# Patient Record
Sex: Female | Born: 1965 | Race: White | Hispanic: No | Marital: Married | Smoking: Never smoker
Health system: Southern US, Community
[De-identification: ages and names within clinical notes are randomized; demographics above are authoritative.]

## PROBLEM LIST (undated history)

## (undated) DIAGNOSIS — I10 Essential (primary) hypertension: Secondary | ICD-10-CM

## (undated) DIAGNOSIS — E119 Type 2 diabetes mellitus without complications: Secondary | ICD-10-CM

## (undated) DIAGNOSIS — I739 Peripheral vascular disease, unspecified: Secondary | ICD-10-CM

## (undated) HISTORY — PX: GALLBLADDER SURGERY: SHX652

## (undated) HISTORY — DX: Peripheral vascular disease, unspecified: I73.9

## (undated) HISTORY — PX: NECK SURGERY: SHX720

---

## 1997-08-31 ENCOUNTER — Encounter (HOSPITAL_COMMUNITY): Admission: RE | Admit: 1997-08-31 | Discharge: 1997-09-11 | Payer: Self-pay | Admitting: Obstetrics and Gynecology

## 1997-09-05 ENCOUNTER — Inpatient Hospital Stay (HOSPITAL_COMMUNITY): Admission: AD | Admit: 1997-09-05 | Discharge: 1997-09-05 | Payer: Self-pay | Admitting: Obstetrics and Gynecology

## 1997-09-09 ENCOUNTER — Inpatient Hospital Stay (HOSPITAL_COMMUNITY): Admission: AD | Admit: 1997-09-09 | Discharge: 1997-09-11 | Payer: Self-pay | Admitting: *Deleted

## 2000-04-14 ENCOUNTER — Other Ambulatory Visit: Admission: RE | Admit: 2000-04-14 | Discharge: 2000-04-14 | Payer: Self-pay | Admitting: Obstetrics and Gynecology

## 2000-08-11 ENCOUNTER — Encounter (HOSPITAL_BASED_OUTPATIENT_CLINIC_OR_DEPARTMENT_OTHER): Payer: Self-pay | Admitting: General Surgery

## 2000-08-14 ENCOUNTER — Encounter (INDEPENDENT_AMBULATORY_CARE_PROVIDER_SITE_OTHER): Payer: Self-pay | Admitting: Specialist

## 2000-08-14 ENCOUNTER — Ambulatory Visit (HOSPITAL_COMMUNITY): Admission: RE | Admit: 2000-08-14 | Discharge: 2000-08-14 | Payer: Self-pay | Admitting: General Surgery

## 2001-07-22 ENCOUNTER — Other Ambulatory Visit: Admission: RE | Admit: 2001-07-22 | Discharge: 2001-07-22 | Payer: Self-pay | Admitting: Obstetrics and Gynecology

## 2002-03-20 ENCOUNTER — Encounter: Payer: Self-pay | Admitting: Emergency Medicine

## 2002-03-20 ENCOUNTER — Inpatient Hospital Stay (HOSPITAL_COMMUNITY): Admission: EM | Admit: 2002-03-20 | Discharge: 2002-03-23 | Payer: Self-pay | Admitting: Emergency Medicine

## 2002-03-22 ENCOUNTER — Encounter (INDEPENDENT_AMBULATORY_CARE_PROVIDER_SITE_OTHER): Payer: Self-pay | Admitting: Specialist

## 2002-03-22 ENCOUNTER — Encounter (HOSPITAL_BASED_OUTPATIENT_CLINIC_OR_DEPARTMENT_OTHER): Payer: Self-pay | Admitting: General Surgery

## 2002-11-18 ENCOUNTER — Encounter: Payer: Self-pay | Admitting: Family Medicine

## 2002-11-18 ENCOUNTER — Encounter: Admission: RE | Admit: 2002-11-18 | Discharge: 2002-11-18 | Payer: Self-pay | Admitting: Family Medicine

## 2003-12-07 ENCOUNTER — Encounter: Admission: RE | Admit: 2003-12-07 | Discharge: 2003-12-07 | Payer: Self-pay | Admitting: Obstetrics and Gynecology

## 2005-04-22 ENCOUNTER — Encounter: Admission: RE | Admit: 2005-04-22 | Discharge: 2005-04-22 | Payer: Self-pay | Admitting: Obstetrics and Gynecology

## 2005-07-11 ENCOUNTER — Encounter: Admission: RE | Admit: 2005-07-11 | Discharge: 2005-07-11 | Payer: Self-pay | Admitting: Family Medicine

## 2006-03-08 ENCOUNTER — Emergency Department (HOSPITAL_COMMUNITY): Admission: EM | Admit: 2006-03-08 | Discharge: 2006-03-08 | Payer: Self-pay | Admitting: Emergency Medicine

## 2006-06-26 ENCOUNTER — Encounter: Admission: RE | Admit: 2006-06-26 | Discharge: 2006-06-26 | Payer: Self-pay | Admitting: Obstetrics and Gynecology

## 2008-09-07 ENCOUNTER — Encounter: Admission: RE | Admit: 2008-09-07 | Discharge: 2008-09-07 | Payer: Self-pay | Admitting: Obstetrics and Gynecology

## 2010-06-03 ENCOUNTER — Encounter: Payer: Self-pay | Admitting: Obstetrics and Gynecology

## 2010-07-03 ENCOUNTER — Other Ambulatory Visit: Payer: Self-pay | Admitting: Family Medicine

## 2010-07-03 DIAGNOSIS — Z1231 Encounter for screening mammogram for malignant neoplasm of breast: Secondary | ICD-10-CM

## 2010-07-12 ENCOUNTER — Ambulatory Visit
Admission: RE | Admit: 2010-07-12 | Discharge: 2010-07-12 | Disposition: A | Discharge status: Home / Self Care | Payer: BC Managed Care – PPO | Source: Ambulatory Visit | Attending: Family Medicine | Admitting: Family Medicine

## 2010-07-12 DIAGNOSIS — Z1231 Encounter for screening mammogram for malignant neoplasm of breast: Secondary | ICD-10-CM

## 2010-09-27 NOTE — Discharge Summary (Signed)
   NAME:  Sherri Thomas, Sherri Thomas                         ACCOUNT NO.:  [id number]   MEDICAL RECORD NO.:  [id number]                   PATIENT TYPE:  INP   LOCATION:  5734                                 FACILITY:  [facility]   PHYSICIAN:  Leonie Man, M.D.                DATE OF BIRTH:  1966/01/15   DATE OF ADMISSION:  03/20/2002  DATE OF DISCHARGE:  03/23/2002                                 DISCHARGE SUMMARY   ADMISSION DIAGNOSIS:  Chronic calculus cholecystitis with biliary colic.   DISCHARGE DIAGNOSIS:  Chronic calculus cholecystitis with biliary colic.   PROCEDURE:  Laparoscopic cholecystectomy with intraoperative cholangiogram.   SURGEON:  Leonie Man, M.D.   ASSISTANT:  Joanne Gavel, M.D.   ANESTHESIA:  General.   HISTORY OF PRESENT ILLNESS:  The patient is a 45 year old woman presenting  with severe upper abdominal pain associated with nausea and vomiting.  On  gallbladder ultrasound she was noted to have cholelithiasis with the  gallbladder showing a very thickened wall.  She was admitted to the hospital  and continued to have pain without resolution after being treated with  antibiotics.   HOSPITAL COURSE:  She was then taken to the operating room on 03/22/02,  where she underwent a laparoscopic cholecystectomy with intraoperative  cholangiogram.  Her postoperative course over the insuring 24 hours has been  benign with normal with normal resumption of diet and bowel activity.  She  is discharged now to be followed up in the office in two weeks.   DISCHARGE MEDICATIONS:  Maxidone one or two q.4h. p.r.n. pain.   ACTIVITY:  As tolerated.   DIET:  Unrestricted.                                                  Leonie Man, M.D.    PB/MEDQ  D:  04/20/2002  T:  04/20/2002  Job:  [id number]

## 2010-09-27 NOTE — H&P (Signed)
   NAME:  Sherri Thomas, Sherri Thomas                         ACCOUNT NO.:  [id number]   MEDICAL RECORD NO.:  [id number]                   PATIENT TYPE:  INP   LOCATION:  1823                                 FACILITY:  [facility]   PHYSICIAN:  Gabrielle Dare. Janee Morn, M.D.             DATE OF BIRTH:  June 07, 1965   DATE OF ADMISSION:  03/20/2002  DATE OF DISCHARGE:                                HISTORY & PHYSICAL   ADDENDUM:  Her laboratory values are as follows:  White blood cell count  10,000, hemoglobin 13, hematocrit 37.4, platelets 345, sodium 134,  potassium, chloride 103, CO2 28, BUN 13, creatinine 0.7, glucose 124, AST  155, ALT 105, alk phos 82 and bilirubin 0.9.                                               Gabrielle Dare Janee Morn, M.D.    BET/MEDQ  D:  03/20/2002  T:  03/21/2002  Job:  [id number]

## 2010-09-27 NOTE — H&P (Signed)
NAME:  Sherri Thomas, Sherri Thomas                         ACCOUNT NO.:  [id number]   MEDICAL RECORD NO.:  [id number]                   PATIENT TYPE:  INP   LOCATION:  1823                                 FACILITY:  [facility]   PHYSICIAN:  Gabrielle Dare. Janee Morn, M.D.             DATE OF BIRTH:  10/05/1965   DATE OF ADMISSION:  03/20/2002  DATE OF DISCHARGE:                                HISTORY & PHYSICAL   HISTORY OF PRESENT ILLNESS:  The patient is a 45 year old white female that  complains of epigastric and right upper quadrant pain, starting at 12:15  p.m. last evening. This pain radiated around to her back and was associated  with some nausea. She had one episode of vomiting. The pain continued and so  she came to the emergency room for evaluation. Currently, she says that her  pain is a little bit better after the pain medication. She still has quite a  bit of epigastric discomfort and some nausea. Workup in the emergency room  included an ultrasound which demonstrated gallstones with no common bile  duct dilatation.   PAST MEDICAL HISTORY:  The patient has scoliosis but denies other medical  problems.   PAST SURGICAL HISTORY:  Includes removal of a lipoma from her abdomen by Dr.  Orson Slick about two years ago.  Also in the distant past, she had cervical  surgery for her scoliosis.   ALLERGIES:  No known drug allergies.   REVIEW OF SYSTEMS:  In general she has no complaints. Cardiac reveals no  chest pain. Pulmonary reveals no shortness of breath. GI: Please see HPI.  Neurologic, she denies any complaints. Psychiatric, she denies any  complaints. Otherwise negative.   PHYSICAL EXAMINATION:  VITAL SIGNS: Temperature 98.6, heart rate 90, blood  pressure 121/64, respiratory rate 18.  GENERAL: Awake, alert and in no acute distress.  HEENT: Sclera nonicteric. Pupils are equal, round, and reactive to light and  accommodation.  NECK: Supple. Old cervical scar anteriorly.  LUNGS: Clear to  auscultation and percussion bilaterally.  HEART: Regular rate and rhythm.  ABDOMEN: Soft with some mild right upper quadrant and epigastric tenderness.  She does have a scar in her left upper quadrant from her lipoma surgery.  EXTREMITIES: Warm with palpable distal pulses.   ASSESSMENT:  Cholecystitis.   PLAN:  We will plan to admit the patient for IV fluid hydration, IV  antibiotics and repeat her labs tomorrow. The patient firmly requests Dr.  Orson Slick to do her surgery, so I will contact him so he can plan on doing her  laparoscopic cholecystectomy.                                               Gabrielle Dare Janee Morn, M.D.    BET/MEDQ  D:  03/20/2002  T:  03/21/2002  Job:  [id number]

## 2010-09-27 NOTE — Op Note (Signed)
[place]  Patient:    Sherri Thomas, Sherri Thomas                        MRN: [id number] Proc. Date: 08/14/00 Attending:  Luisa Hart L. Lurene Shadow, M.D.                           Operative Report  PREOPERATIVE DIAGNOSIS:  Lipoma, left chest wall (submammary).  POSTOPERATIVE DIAGNOSIS:  Lipoma, left chest wall (submammary).  OPERATION PERFORMED:  Excision of lipoma, left chest wall.  SURGEON:  Mardene Celeste. Lurene Shadow, M.D.  ASSISTANT:  Nurse.  ANESTHESIA:  General.  INDICATIONS FOR PROCEDURE:  The patient is a 45 year old woman with an enlarging mass in the submammary region of the left chest wall, extending down to the left subcostal region.  This is causing some discomfort and ill-fitting of her undergarments.  She is brought to the operating room now for excision.   DESCRIPTION OF PROCEDURE:  Following the induction of satisfactory anesthesia, the patient was positioned supinely.  The anterior chest was prepped and draped to be included in a sterile operative field.  I made a transverse incision over the lipoma and deepened this through the skin and subcutaneous tissues.  The lipoma was dissected free from the surrounding tissues sharply and removed in its entirety and forwarded for pathologic evaluation.  The hemostasis was obtained with electrocautery.  Sponge, instrument and sharp counts were verified.  Wound closed in two layers with interrupted 3-0 Vicryl suture in the subcutaneous layer and running 5-0 Monocryl in the skin.  The wound was reinforced with Steri-Strips.  Sterile dressings applied. Anesthetic reversed.  Patient removed from the operating room to the recovery room in stable condition having tolerated the procedure well. DD:  08/14/00 TD:  08/14/00 Job: [id number]

## 2010-09-27 NOTE — Op Note (Signed)
NAME:  Sherri Thomas, Sherri Thomas                         ACCOUNT NO.:  [id number]   MEDICAL RECORD NO.:  [id number]                   PATIENT TYPE:  INP   LOCATION:  5734                                 FACILITY:  [facility]   PHYSICIAN:  Leonie Man, M.D.                DATE OF BIRTH:  [birth year]   DATE OF PROCEDURE:  03/22/2002  DATE OF DISCHARGE:                                 OPERATIVE REPORT   PREOPERATIVE DIAGNOSIS:  Acute cholecystitis.   POSTOPERATIVE DIAGNOSIS:  Acute cholecystitis.   OPERATION PERFORMED:  Laparoscopic cholecystectomy with operative  cholangiogram.   SURGEON:  Leonie Man, M.D.   ASSISTANT:  Joanne Gavel, M.D.   ANESTHESIA:  General.   INDICATIONS FOR PROCEDURE:  The patient is a 45 year old woman presenting  with severe upper abdominal pain, associated nausea and vomiting, who on  gallbladder ultrasound was noted to have cholelithiasis with gallbladder  showing thickened wall.  She was admitted to the hospital, continued to have  pain, scheduled and brought now to the operating room for laparoscopic  cholecystectomy.  Lipase and amylase levels have been within normal limits.  Liver function studies were mildly elevated but returned to normal very  quickly.  The patient understands the risks and potential benefits of  surgery and all questions are answered and consent obtained.   DESCRIPTION OF PROCEDURE:  Following induction of satisfactory anesthesia  with the patient positioned supinely, the abdomen was prepped and draped to  be included in the sterile operative field.  Open laparoscopy created at the  umbilicus with insertion of a Hasson type cannula and insufflation of the  peritoneal cavity to 14 mmHg pressure.  The camera was inserted and  exploration of the abdomen carried out.  Liver edges were sharp, liver  surfaces smooth, the gallbladder was distended and edematous.  None of the  large or small intestines viewed appeared to be abnormal.  The left  upper  quadrant was thoroughly viewed and there were no abnormalities noted.  Under  direct vision, epigastric and lateral ports were placed.  The gallbladder  was grasped and retracted cephalad.  Then the dissection carried down into  the region of the ampulla and the hepatoduodenal ligament with isolation of  the cystic artery and cystic duct.  The cystic duct was completely occluded  by a large stone which was carefully milked back into the gallbladder and  then clipped proximally at the gallbladder cystic duct junction.  The cystic  artery was then isolated, doubly clipped and transected.  The cystic duct  cholangiogram was then carried out  by inserting a Reddick catheter into the  cystic duct and injecting one half strength Hypaque into the biliary system.  The resulting cholangiogram showed free flow of contrast into the duodenum  with no filling defects.  The upper radicals appeared to be normal.  The  cystic catheter was then removed.  The cystic duct  was doubly clipped and  then transected.  The gallbladder was then dissected free from the liver bed  maintaining hemostasis throughout the course of dissection.  At the end of  dissection, the liver bed was thoroughly inspected.  Additional bleeding  points were treated with electrocautery.  The gallbladder was then placed in  an endo pouch and retrieved through the umbilicus without difficulty.  Sponge, instrument and sharp counts were then verified.  The  pneumoperitoneum was allowed to deflate and the wounds closed in  layers as  follows.  The umbilical wound in two layers with 0 Dexon and 4-0  Vicryl.  The epigastrium and lateral flank wounds were closed with 4-0  Vicryl sutures.  All wounds were reinforced with Steri-Strips.  Sterile  dressings were applied.  Anesthetic was reversed and the patient removed  from the operating room to the recovery room in stable condition, having  tolerated the procedure well.                                                  Leonie Man, M.D.    PB/MEDQ  D:  03/22/2002  T:  03/22/2002  Job:  [id number]

## 2011-07-09 ENCOUNTER — Other Ambulatory Visit: Payer: Self-pay | Admitting: Family Medicine

## 2011-07-09 DIAGNOSIS — Z1231 Encounter for screening mammogram for malignant neoplasm of breast: Secondary | ICD-10-CM

## 2011-07-17 ENCOUNTER — Ambulatory Visit
Admission: RE | Admit: 2011-07-17 | Discharge: 2011-07-17 | Disposition: A | Discharge status: Home / Self Care | Payer: BC Managed Care – PPO | Source: Ambulatory Visit | Attending: Family Medicine | Admitting: Family Medicine

## 2011-07-17 DIAGNOSIS — Z1231 Encounter for screening mammogram for malignant neoplasm of breast: Secondary | ICD-10-CM

## 2012-06-15 ENCOUNTER — Other Ambulatory Visit: Payer: Self-pay | Admitting: Family Medicine

## 2012-06-15 DIAGNOSIS — Z1231 Encounter for screening mammogram for malignant neoplasm of breast: Secondary | ICD-10-CM

## 2012-07-19 ENCOUNTER — Ambulatory Visit
Admission: RE | Admit: 2012-07-19 | Discharge: 2012-07-19 | Disposition: A | Discharge status: Home / Self Care | Payer: BC Managed Care – PPO | Source: Ambulatory Visit | Attending: Family Medicine | Admitting: Family Medicine

## 2012-07-19 DIAGNOSIS — Z1231 Encounter for screening mammogram for malignant neoplasm of breast: Secondary | ICD-10-CM

## 2013-07-26 ENCOUNTER — Other Ambulatory Visit: Payer: Self-pay

## 2013-07-26 DIAGNOSIS — Z1231 Encounter for screening mammogram for malignant neoplasm of breast: Secondary | ICD-10-CM

## 2013-07-28 ENCOUNTER — Ambulatory Visit
Admission: RE | Admit: 2013-07-28 | Discharge: 2013-07-28 | Disposition: A | Discharge status: Home / Self Care | Payer: BC Managed Care – PPO | Source: Ambulatory Visit

## 2013-07-28 DIAGNOSIS — Z1231 Encounter for screening mammogram for malignant neoplasm of breast: Secondary | ICD-10-CM

## 2013-10-19 ENCOUNTER — Other Ambulatory Visit: Payer: Self-pay | Admitting: Otolaryngology

## 2013-10-19 DIAGNOSIS — R221 Localized swelling, mass and lump, neck: Secondary | ICD-10-CM

## 2013-10-20 ENCOUNTER — Ambulatory Visit
Admission: RE | Admit: 2013-10-20 | Discharge: 2013-10-20 | Disposition: A | Discharge status: Home / Self Care | Payer: BC Managed Care – PPO | Source: Ambulatory Visit | Attending: Otolaryngology | Admitting: Otolaryngology

## 2013-10-20 ENCOUNTER — Encounter (INDEPENDENT_AMBULATORY_CARE_PROVIDER_SITE_OTHER): Payer: Self-pay

## 2013-10-20 DIAGNOSIS — R221 Localized swelling, mass and lump, neck: Secondary | ICD-10-CM

## 2013-10-20 MED ORDER — IOHEXOL 300 MG/ML  SOLN
75.0000 mL | Freq: Once | INTRAMUSCULAR | Status: AC | PRN
  Administered 2013-10-20: 75 mL via INTRAVENOUS

## 2013-10-24 ENCOUNTER — Other Ambulatory Visit: Payer: BC Managed Care – PPO

## 2014-06-09 ENCOUNTER — Ambulatory Visit (HOSPITAL_COMMUNITY)
Admission: RE | Admit: 2014-06-09 | Discharge: 2014-06-09 | Disposition: A | Discharge status: Home / Self Care | Payer: BLUE CROSS/BLUE SHIELD | Source: Ambulatory Visit | Attending: Family Medicine | Admitting: Family Medicine

## 2014-06-09 ENCOUNTER — Other Ambulatory Visit (HOSPITAL_COMMUNITY): Payer: Self-pay | Admitting: Family Medicine

## 2014-06-09 DIAGNOSIS — R109 Unspecified abdominal pain: Secondary | ICD-10-CM

## 2014-06-09 DIAGNOSIS — R1032 Left lower quadrant pain: Secondary | ICD-10-CM | POA: Diagnosis not present

## 2014-06-09 MED ORDER — IOHEXOL 300 MG/ML  SOLN
50.0000 mL | Freq: Once | INTRAMUSCULAR | Status: AC | PRN
  Administered 2014-06-09: 50 mL via ORAL

## 2014-06-09 MED ORDER — IOHEXOL 300 MG/ML  SOLN
100.0000 mL | Freq: Once | INTRAMUSCULAR | Status: AC | PRN
  Administered 2014-06-09: 100 mL via INTRAVENOUS

## 2014-07-28 ENCOUNTER — Other Ambulatory Visit: Payer: Self-pay

## 2014-07-28 DIAGNOSIS — Z1231 Encounter for screening mammogram for malignant neoplasm of breast: Secondary | ICD-10-CM

## 2014-07-31 ENCOUNTER — Encounter (INDEPENDENT_AMBULATORY_CARE_PROVIDER_SITE_OTHER): Payer: Self-pay

## 2014-07-31 ENCOUNTER — Ambulatory Visit
Admission: RE | Admit: 2014-07-31 | Discharge: 2014-07-31 | Disposition: A | Discharge status: Home / Self Care | Payer: BLUE CROSS/BLUE SHIELD | Source: Ambulatory Visit

## 2014-07-31 DIAGNOSIS — Z1231 Encounter for screening mammogram for malignant neoplasm of breast: Secondary | ICD-10-CM

## 2015-09-10 DIAGNOSIS — Z1389 Encounter for screening for other disorder: Secondary | ICD-10-CM | POA: Diagnosis not present

## 2015-09-10 DIAGNOSIS — Z1231 Encounter for screening mammogram for malignant neoplasm of breast: Secondary | ICD-10-CM | POA: Diagnosis not present

## 2015-09-10 DIAGNOSIS — Z01419 Encounter for gynecological examination (general) (routine) without abnormal findings: Secondary | ICD-10-CM | POA: Diagnosis not present

## 2015-09-10 DIAGNOSIS — Z130 Encounter for screening for diseases of the blood and blood-forming organs and certain disorders involving the immune mechanism: Secondary | ICD-10-CM | POA: Diagnosis not present

## 2015-09-10 DIAGNOSIS — Z124 Encounter for screening for malignant neoplasm of cervix: Secondary | ICD-10-CM | POA: Diagnosis not present

## 2016-01-18 DIAGNOSIS — R7301 Impaired fasting glucose: Secondary | ICD-10-CM | POA: Diagnosis not present

## 2016-01-18 DIAGNOSIS — Z23 Encounter for immunization: Secondary | ICD-10-CM | POA: Diagnosis not present

## 2016-01-18 DIAGNOSIS — M1990 Unspecified osteoarthritis, unspecified site: Secondary | ICD-10-CM | POA: Diagnosis not present

## 2016-01-18 DIAGNOSIS — I10 Essential (primary) hypertension: Secondary | ICD-10-CM | POA: Diagnosis not present

## 2016-05-03 DIAGNOSIS — J069 Acute upper respiratory infection, unspecified: Secondary | ICD-10-CM | POA: Diagnosis not present

## 2016-07-21 DIAGNOSIS — R05 Cough: Secondary | ICD-10-CM | POA: Diagnosis not present

## 2016-07-21 DIAGNOSIS — M1990 Unspecified osteoarthritis, unspecified site: Secondary | ICD-10-CM | POA: Diagnosis not present

## 2016-07-21 DIAGNOSIS — I10 Essential (primary) hypertension: Secondary | ICD-10-CM | POA: Diagnosis not present

## 2016-07-21 DIAGNOSIS — R7301 Impaired fasting glucose: Secondary | ICD-10-CM | POA: Diagnosis not present

## 2016-08-19 ENCOUNTER — Other Ambulatory Visit: Payer: Self-pay | Admitting: Obstetrics and Gynecology

## 2016-08-19 DIAGNOSIS — Z1231 Encounter for screening mammogram for malignant neoplasm of breast: Secondary | ICD-10-CM

## 2016-09-15 ENCOUNTER — Ambulatory Visit
Admission: RE | Admit: 2016-09-15 | Discharge: 2016-09-15 | Disposition: A | Discharge status: Home / Self Care | Payer: BLUE CROSS/BLUE SHIELD | Source: Ambulatory Visit | Attending: Obstetrics and Gynecology | Admitting: Obstetrics and Gynecology

## 2016-09-15 DIAGNOSIS — Z1231 Encounter for screening mammogram for malignant neoplasm of breast: Secondary | ICD-10-CM

## 2016-09-15 DIAGNOSIS — Z1389 Encounter for screening for other disorder: Secondary | ICD-10-CM | POA: Diagnosis not present

## 2016-09-15 DIAGNOSIS — Z01419 Encounter for gynecological examination (general) (routine) without abnormal findings: Secondary | ICD-10-CM | POA: Diagnosis not present

## 2016-09-15 DIAGNOSIS — Z130 Encounter for screening for diseases of the blood and blood-forming organs and certain disorders involving the immune mechanism: Secondary | ICD-10-CM | POA: Diagnosis not present

## 2016-09-15 DIAGNOSIS — Z124 Encounter for screening for malignant neoplasm of cervix: Secondary | ICD-10-CM | POA: Diagnosis not present

## 2016-09-15 DIAGNOSIS — Z6833 Body mass index (BMI) 33.0-33.9, adult: Secondary | ICD-10-CM | POA: Diagnosis not present

## 2016-11-24 ENCOUNTER — Other Ambulatory Visit: Payer: Self-pay | Admitting: Family Medicine

## 2016-11-24 DIAGNOSIS — I8393 Asymptomatic varicose veins of bilateral lower extremities: Secondary | ICD-10-CM | POA: Diagnosis not present

## 2016-11-24 DIAGNOSIS — M79605 Pain in left leg: Secondary | ICD-10-CM

## 2016-11-26 ENCOUNTER — Ambulatory Visit
Admission: RE | Admit: 2016-11-26 | Discharge: 2016-11-26 | Disposition: A | Discharge status: Home / Self Care | Payer: BLUE CROSS/BLUE SHIELD | Source: Ambulatory Visit | Attending: Family Medicine | Admitting: Family Medicine

## 2016-11-26 DIAGNOSIS — I878 Other specified disorders of veins: Secondary | ICD-10-CM | POA: Diagnosis not present

## 2016-11-26 DIAGNOSIS — M79605 Pain in left leg: Secondary | ICD-10-CM

## 2016-12-04 ENCOUNTER — Other Ambulatory Visit: Payer: Self-pay

## 2016-12-04 DIAGNOSIS — M79605 Pain in left leg: Secondary | ICD-10-CM

## 2016-12-04 DIAGNOSIS — I779 Disorder of arteries and arterioles, unspecified: Secondary | ICD-10-CM

## 2017-01-01 ENCOUNTER — Ambulatory Visit (INDEPENDENT_AMBULATORY_CARE_PROVIDER_SITE_OTHER): Payer: BLUE CROSS/BLUE SHIELD | Admitting: Vascular Surgery

## 2017-01-01 ENCOUNTER — Ambulatory Visit (HOSPITAL_COMMUNITY)
Admission: RE | Admit: 2017-01-01 | Discharge: 2017-01-01 | Disposition: A | Discharge status: Home / Self Care | Payer: BLUE CROSS/BLUE SHIELD | Source: Ambulatory Visit | Attending: Vascular Surgery | Admitting: Vascular Surgery

## 2017-01-01 ENCOUNTER — Encounter: Payer: Self-pay | Admitting: Vascular Surgery

## 2017-01-01 VITALS — BP 130/79 | HR 65 | Temp 97.8°F | Resp 18 | Ht 62.0 in | Wt 180.0 lb

## 2017-01-01 DIAGNOSIS — I739 Peripheral vascular disease, unspecified: Secondary | ICD-10-CM

## 2017-01-01 DIAGNOSIS — M79605 Pain in left leg: Secondary | ICD-10-CM | POA: Diagnosis not present

## 2017-01-01 DIAGNOSIS — I779 Disorder of arteries and arterioles, unspecified: Secondary | ICD-10-CM | POA: Diagnosis not present

## 2017-01-01 DIAGNOSIS — I7409 Other arterial embolism and thrombosis of abdominal aorta: Secondary | ICD-10-CM | POA: Diagnosis not present

## 2017-01-01 NOTE — Progress Notes (Signed)
Referring Physician: Dr Manus Gunning  Patient name: Sherri Thomas MRN: [id number] DOB: May 24, 1965 Sex: female  REASON FOR CONSULT:   HPI: Sherri Thomas is a 51 y.o. female referred for evaluation for peripheral arterial disease. The patient has been complaining of bilateral leg pain. She occasionally has some low back pain that is also been present for several months. She really describe swelling in her legs more so than actual pain. She does state that her legs hurt when she is asleep and sometimes when she walks but this is not at a consistent or reliable distance. She developed left leg swelling intermittently more than her right. She has no history of nonhealing wounds.   Past Medical History:  Diagnosis Date  . Peripheral vascular disease [facility])    Past Surgical History:  Procedure Laterality Date  . GALLBLADDER SURGERY      Family History  Problem Relation Age of Onset  . Breast cancer Maternal Aunt   . Heart disease Mother   . Anuerysm Mother   . Heart Problems Mother   . Heart attack Mother   . Heart defect Father   . Heart Problems Sister   . Heart attack Sister   . Heart defect Brother   . Heart disease Maternal Grandmother     SOCIAL HISTORY: Social History   Social History  . Marital status: Married    Spouse name: N/A  . Number of children: N/A  . Years of education: N/A   Occupational History  . Not on file.   Social History Main Topics  . Smoking status: Never Smoker  . Smokeless tobacco: Never Used  . Alcohol use No  . Drug use: No  . Sexual activity: Not on file   Other Topics Concern  . Not on file   Social History Narrative  . No narrative on file    No Known Allergies  Current Outpatient Prescriptions  Medication Sig Dispense Refill  . atenolol (TENORMIN) 50 MG tablet   0  . chlorthalidone (HYGROTON) 25 MG tablet   0   No current facility-administered medications for this visit.     ROS:   General:  No weight loss, Fever,  chills  HEENT: No recent headaches, no nasal bleeding, no visual changes, no sore throat  Neurologic: No dizziness, blackouts, seizures. No recent symptoms of stroke or mini- stroke. No recent episodes of slurred speech, or temporary blindness.  Cardiac: No recent episodes of chest pain/pressure, no shortness of breath at rest.  No shortness of breath with exertion.  Denies history of atrial fibrillation or irregular heartbeat  Vascular: No history of rest pain in feet.  No history of claudication.  No history of non-healing ulcer, No history of DVT   Pulmonary: No home oxygen, no productive cough, no hemoptysis,  No asthma or wheezing  Musculoskeletal:  [ ]  Arthritis, [X]  Low back pain,  [ ]  Joint pain  Hematologic:No history of hypercoagulable state.  No history of easy bleeding.  No history of anemia  Gastrointestinal: No hematochezia or melena,  No gastroesophageal reflux, no trouble swallowing  Urinary: [ ]  chronic Kidney disease, [ ]  on HD - [ ]  MWF or [ ]  TTHS, [ ]  Burning with urination, [ ]  Frequent urination, [ ]  Difficulty urinating;   Skin: No rashes  Psychological: No history of anxiety,  No history of depression   Physical Examination  Vitals:   01/01/17 0945  BP: 130/79  Pulse: 65  Resp: 18  Temp:  97.8 F (36.6 C)  SpO2: 99%  Weight: 180 lb (81.6 kg)  Height: 5\' 2"  (1.575 m)    Body mass index is 32.92 kg/m.  General:  Alert and oriented, no acute distress HEENT: Normal Neck: No bruit or JVD Pulmonary: Clear to auscultation bilaterally Cardiac: Regular Rate and Rhythm without murmur Abdomen: Soft, non-tender, non-distended, no mass Skin: No rash Extremity Pulses:  2+ radial, brachial, femoral, dorsalis pedis  pulses bilaterally Musculoskeletal: No deformity Trace left leg pretibial edema  Neurologic: Upper and lower extremity motor 5/5 and symmetric  DATA:  Patient had had aortoiliac duplex evaluation at our office today. She had triphasic  waveforms throughout the common and external iliac artery bilaterally. Her aorta had no evidence of plaque but appeared to possibly have some mild narrowing. I reviewed these images today.  She previously had lower extremity arterial duplex with exercise on 11/26/2016. ABIs at rest were 1.17 on the right 1.12 on the left.  Although her ABIs were 1.22 on the right and 1.1 on the left after exercise. Her study was read out as possible aortoiliac occlusive disease.  I reviewed the images from a CT scan of the abdomen and pelvis which were done for abdominal pain but with contrast in 2016. This did not show any plaque in the aortoiliac system or any significant narrowing.   ASSESSMENT:  Patient primarily with symptoms of left leg swelling some occasional leg pain. This does not sound consistent with claudication. However, she had ABIs with exercise which were suggestive of possible aortoiliac occlusive disease as well as possible mild narrowing of her aorta on our duplex scan in the office today. She has easily palpable pulses in her feet which is somewhat in conflict with her noninvasive studies. She really has no significant risk factors for peripheral arterial disease. I believe the best option to further delineate where should be she has an arterial component to her symptoms would be a CT angiogram with runoff. We will schedule this for her within the next few weeks.     Fabienne Bruns, MD [facility] [place] [place] Office: [phone] Pager: [phone]

## 2017-01-02 NOTE — Addendum Note (Signed)
Addended by: Burton Apley A on: 01/02/2017 08:51 AM   Modules accepted: Orders

## 2017-01-07 ENCOUNTER — Encounter: Payer: Self-pay | Admitting: Vascular Surgery

## 2017-01-13 ENCOUNTER — Other Ambulatory Visit: Payer: BLUE CROSS/BLUE SHIELD

## 2017-01-15 ENCOUNTER — Encounter: Payer: Self-pay | Admitting: Vascular Surgery

## 2017-01-15 ENCOUNTER — Ambulatory Visit (INDEPENDENT_AMBULATORY_CARE_PROVIDER_SITE_OTHER): Payer: BLUE CROSS/BLUE SHIELD | Admitting: Vascular Surgery

## 2017-01-15 ENCOUNTER — Ambulatory Visit
Admission: RE | Admit: 2017-01-15 | Discharge: 2017-01-15 | Disposition: A | Discharge status: Home / Self Care | Payer: BLUE CROSS/BLUE SHIELD | Source: Ambulatory Visit | Attending: Vascular Surgery | Admitting: Vascular Surgery

## 2017-01-15 VITALS — BP 128/77 | HR 55 | Temp 97.0°F | Ht 62.0 in | Wt 181.0 lb

## 2017-01-15 DIAGNOSIS — M7989 Other specified soft tissue disorders: Secondary | ICD-10-CM | POA: Diagnosis not present

## 2017-01-15 DIAGNOSIS — I739 Peripheral vascular disease, unspecified: Secondary | ICD-10-CM

## 2017-01-15 DIAGNOSIS — K760 Fatty (change of) liver, not elsewhere classified: Secondary | ICD-10-CM | POA: Diagnosis not present

## 2017-01-15 DIAGNOSIS — I7409 Other arterial embolism and thrombosis of abdominal aorta: Secondary | ICD-10-CM

## 2017-01-15 DIAGNOSIS — I779 Disorder of arteries and arterioles, unspecified: Secondary | ICD-10-CM

## 2017-01-15 MED ORDER — IOPAMIDOL (ISOVUE-370) INJECTION 76%
120.0000 mL | Freq: Once | INTRAVENOUS | Status: AC | PRN
  Administered 2017-01-15: 120 mL via INTRAVENOUS

## 2017-01-15 NOTE — Progress Notes (Signed)
Patient is a 51 year old female who returns for follow-up today after recent CT angiogram to evaluate for peripheral arterial disease. She previously had a noninvasive exam which suggested aortoiliac occlusive disease and possible distal aortic narrowing. She still has some swelling especially in her left leg. It is intermittent in nature. Sometimes she does have some right leg swelling as well. She has no history of nonhealing wounds. She does not describe claudication.  Physical exam:  Vitals:   01/15/17 0954  BP: 128/77  Pulse: (!) 55  Temp: (!) 97 F (36.1 C)  TempSrc: Oral  SpO2: 98%  Weight: 181 lb (82.1 kg)  Height: 5\' 2"  (1.575 m)    Extremities: Palpable dorsalis pedis pulses bilaterally  Data: Patient had a CT angiogram today. This showed widely patent aortoiliac and entire left lower extremity patent arterial vessels. The images were reviewed with the patient today.  Assessment: No evidence of arterial occlusive disease no further arterial workup warranted. The patient does have some intermittent left leg swelling. She may have some element of venous reflux. She was given a prescription today for lower extremity compression stockings. If she does not have improvement in her symptoms with this and wishes further evaluation she will call us in the future.  Plan: The patient will follow-up on as-needed basis.  Fabienne Brunsharles Fields, MD [facility] [place] Office: [phone] Pager: [phone]

## 2017-01-16 ENCOUNTER — Encounter: Payer: Self-pay | Admitting: Vascular Surgery

## 2017-01-21 ENCOUNTER — Encounter (HOSPITAL_COMMUNITY): Payer: BLUE CROSS/BLUE SHIELD

## 2017-01-21 ENCOUNTER — Encounter: Payer: BLUE CROSS/BLUE SHIELD | Admitting: Vascular Surgery

## 2017-02-06 DIAGNOSIS — R7303 Prediabetes: Secondary | ICD-10-CM | POA: Diagnosis not present

## 2017-02-12 ENCOUNTER — Ambulatory Visit: Payer: BLUE CROSS/BLUE SHIELD | Admitting: Vascular Surgery

## 2017-02-17 DIAGNOSIS — Z23 Encounter for immunization: Secondary | ICD-10-CM | POA: Diagnosis not present

## 2017-02-17 DIAGNOSIS — E119 Type 2 diabetes mellitus without complications: Secondary | ICD-10-CM | POA: Diagnosis not present

## 2017-10-09 ENCOUNTER — Ambulatory Visit
Admission: RE | Admit: 2017-10-09 | Discharge: 2017-10-09 | Disposition: A | Discharge status: Home / Self Care | Payer: BLUE CROSS/BLUE SHIELD | Source: Ambulatory Visit | Attending: Family Medicine | Admitting: Family Medicine

## 2017-10-09 ENCOUNTER — Other Ambulatory Visit: Payer: Self-pay | Admitting: Family Medicine

## 2017-10-09 DIAGNOSIS — Z1231 Encounter for screening mammogram for malignant neoplasm of breast: Secondary | ICD-10-CM

## 2017-10-13 ENCOUNTER — Other Ambulatory Visit: Payer: Self-pay | Admitting: Family Medicine

## 2017-10-13 DIAGNOSIS — R928 Other abnormal and inconclusive findings on diagnostic imaging of breast: Secondary | ICD-10-CM

## 2017-10-15 ENCOUNTER — Ambulatory Visit
Admission: RE | Admit: 2017-10-15 | Discharge: 2017-10-15 | Disposition: A | Discharge status: Home / Self Care | Payer: BLUE CROSS/BLUE SHIELD | Source: Ambulatory Visit | Attending: Family Medicine | Admitting: Family Medicine

## 2017-10-15 ENCOUNTER — Other Ambulatory Visit: Payer: Self-pay | Admitting: Family Medicine

## 2017-10-15 DIAGNOSIS — R928 Other abnormal and inconclusive findings on diagnostic imaging of breast: Secondary | ICD-10-CM

## 2017-10-15 DIAGNOSIS — N6012 Diffuse cystic mastopathy of left breast: Secondary | ICD-10-CM | POA: Diagnosis not present

## 2017-10-15 DIAGNOSIS — N6320 Unspecified lump in the left breast, unspecified quadrant: Secondary | ICD-10-CM

## 2017-10-15 DIAGNOSIS — R922 Inconclusive mammogram: Secondary | ICD-10-CM | POA: Diagnosis not present

## 2018-01-21 DIAGNOSIS — Z23 Encounter for immunization: Secondary | ICD-10-CM | POA: Diagnosis not present

## 2018-01-21 DIAGNOSIS — E119 Type 2 diabetes mellitus without complications: Secondary | ICD-10-CM | POA: Diagnosis not present

## 2018-01-21 DIAGNOSIS — I10 Essential (primary) hypertension: Secondary | ICD-10-CM | POA: Diagnosis not present

## 2018-02-19 DIAGNOSIS — E119 Type 2 diabetes mellitus without complications: Secondary | ICD-10-CM | POA: Diagnosis not present

## 2018-03-12 DIAGNOSIS — Z713 Dietary counseling and surveillance: Secondary | ICD-10-CM | POA: Diagnosis not present

## 2018-04-19 ENCOUNTER — Other Ambulatory Visit: Payer: BLUE CROSS/BLUE SHIELD

## 2018-04-29 DIAGNOSIS — Z713 Dietary counseling and surveillance: Secondary | ICD-10-CM | POA: Diagnosis not present

## 2018-05-13 DIAGNOSIS — E1165 Type 2 diabetes mellitus with hyperglycemia: Secondary | ICD-10-CM | POA: Diagnosis not present

## 2018-05-27 DIAGNOSIS — E119 Type 2 diabetes mellitus without complications: Secondary | ICD-10-CM | POA: Diagnosis not present

## 2018-10-19 DIAGNOSIS — E119 Type 2 diabetes mellitus without complications: Secondary | ICD-10-CM | POA: Diagnosis not present

## 2018-10-19 DIAGNOSIS — I10 Essential (primary) hypertension: Secondary | ICD-10-CM | POA: Diagnosis not present

## 2018-12-20 ENCOUNTER — Other Ambulatory Visit: Payer: Self-pay | Admitting: Family Medicine

## 2018-12-20 DIAGNOSIS — R921 Mammographic calcification found on diagnostic imaging of breast: Secondary | ICD-10-CM

## 2018-12-31 ENCOUNTER — Other Ambulatory Visit: Payer: Self-pay

## 2018-12-31 ENCOUNTER — Ambulatory Visit
Admission: RE | Admit: 2018-12-31 | Discharge: 2018-12-31 | Disposition: A | Discharge status: Home / Self Care | Payer: BC Managed Care – PPO | Source: Ambulatory Visit | Attending: Family Medicine | Admitting: Family Medicine

## 2018-12-31 ENCOUNTER — Ambulatory Visit
Admission: RE | Admit: 2018-12-31 | Discharge: 2018-12-31 | Disposition: A | Discharge status: Home / Self Care | Payer: BLUE CROSS/BLUE SHIELD | Source: Ambulatory Visit | Attending: Family Medicine | Admitting: Family Medicine

## 2018-12-31 DIAGNOSIS — R921 Mammographic calcification found on diagnostic imaging of breast: Secondary | ICD-10-CM

## 2018-12-31 DIAGNOSIS — N6489 Other specified disorders of breast: Secondary | ICD-10-CM | POA: Diagnosis not present

## 2018-12-31 DIAGNOSIS — N6012 Diffuse cystic mastopathy of left breast: Secondary | ICD-10-CM | POA: Diagnosis not present

## 2019-05-27 DIAGNOSIS — E119 Type 2 diabetes mellitus without complications: Secondary | ICD-10-CM | POA: Diagnosis not present

## 2019-05-27 DIAGNOSIS — I10 Essential (primary) hypertension: Secondary | ICD-10-CM | POA: Diagnosis not present

## 2019-05-27 DIAGNOSIS — Z0000 Encounter for general adult medical examination without abnormal findings: Secondary | ICD-10-CM | POA: Diagnosis not present

## 2019-05-27 DIAGNOSIS — Z13220 Encounter for screening for lipoid disorders: Secondary | ICD-10-CM | POA: Diagnosis not present

## 2019-07-19 DIAGNOSIS — M25562 Pain in left knee: Secondary | ICD-10-CM | POA: Diagnosis not present

## 2019-07-21 DIAGNOSIS — M25562 Pain in left knee: Secondary | ICD-10-CM | POA: Diagnosis not present

## 2019-09-12 IMAGING — MG DIGITAL DIAGNOSTIC BILATERAL MAMMOGRAM WITH TOMO AND CAD
8 series · 8 of 24 positions shown · non-contrast
Comparison: Previous exam(s).

CLINICAL DATA: 53-year-old patient presents for annual examination
and follow-up of 2 probably benign complicated cysts in the left
breast. She is asymptomatic.

EXAM:
DIGITAL DIAGNOSTIC BILATERAL MAMMOGRAM WITH CAD AND TOMO
ULTRASOUND LEFT BREAST

[L MLO synth-2D]
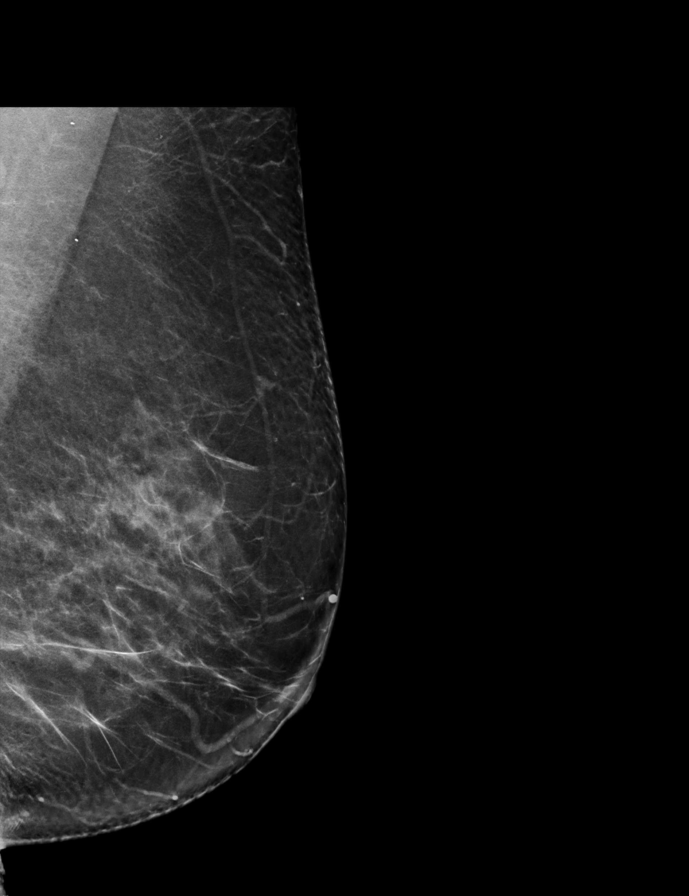

[R CC synth-2D]
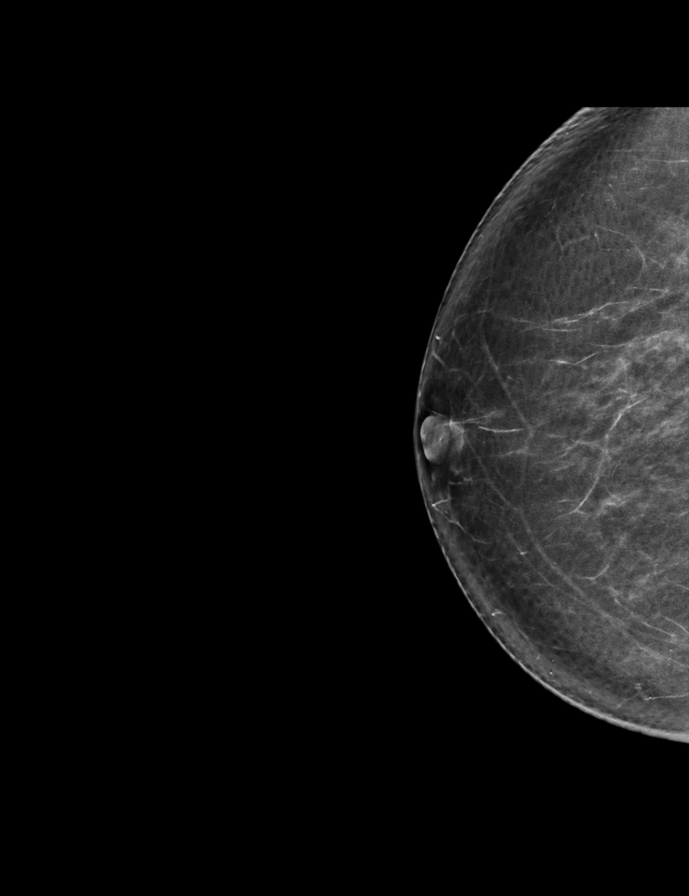

[R MLO synth-2D]
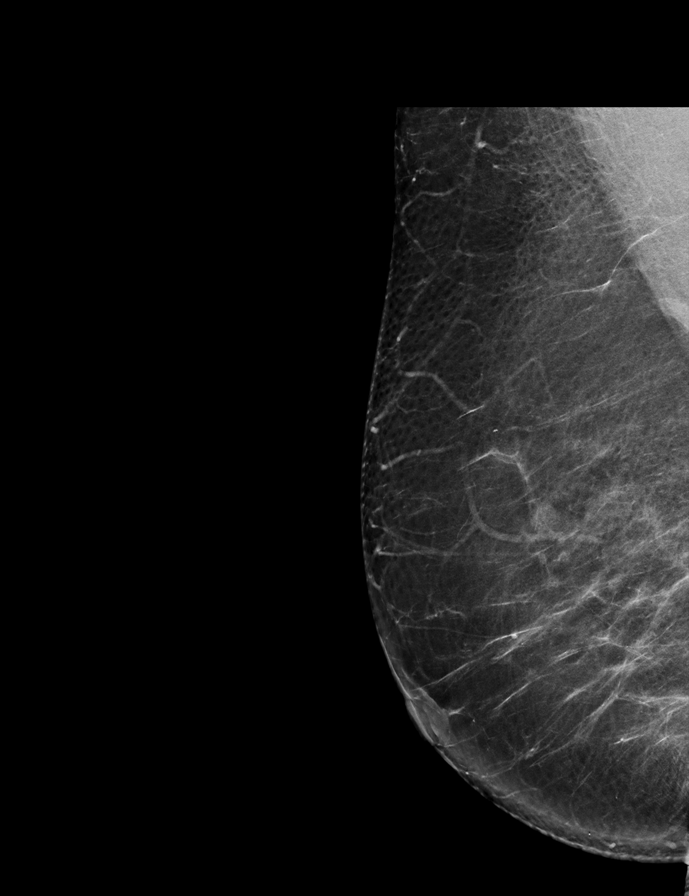

[L CC synth-2D]
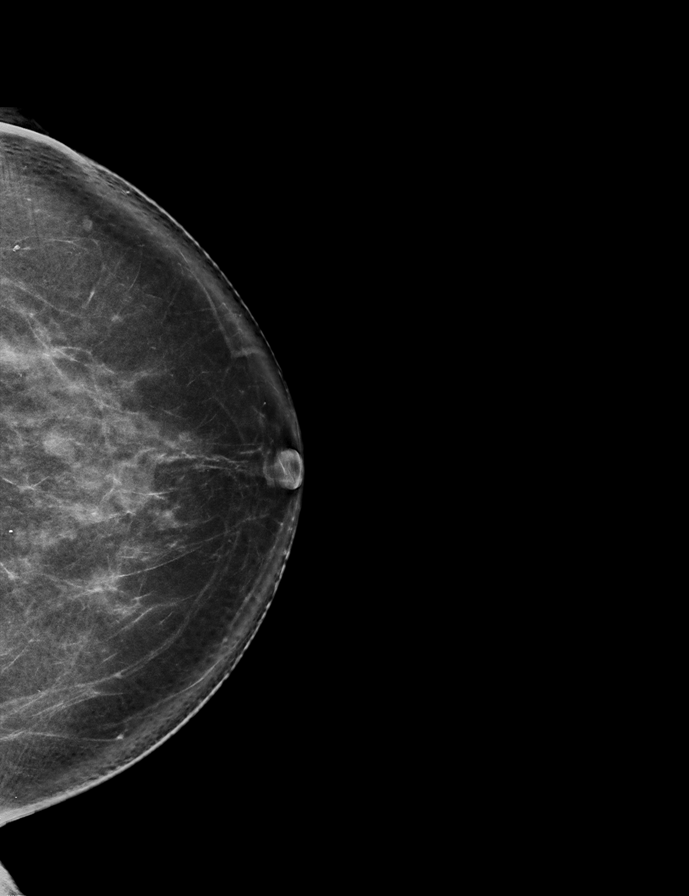

[R CC tomo · tomo slice 37/72.0]
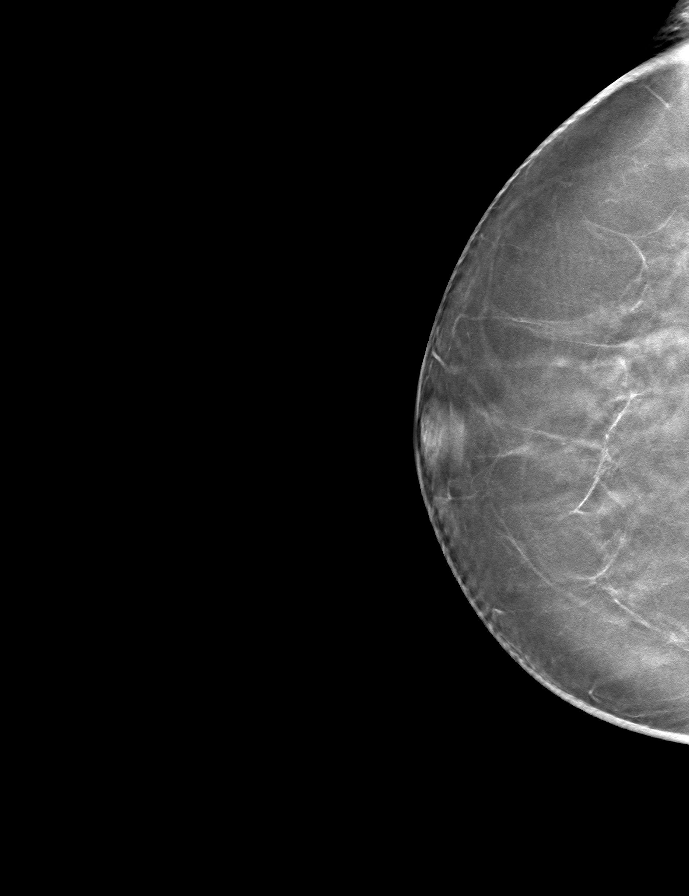

[L MLO tomo · tomo slice 37/74.0]
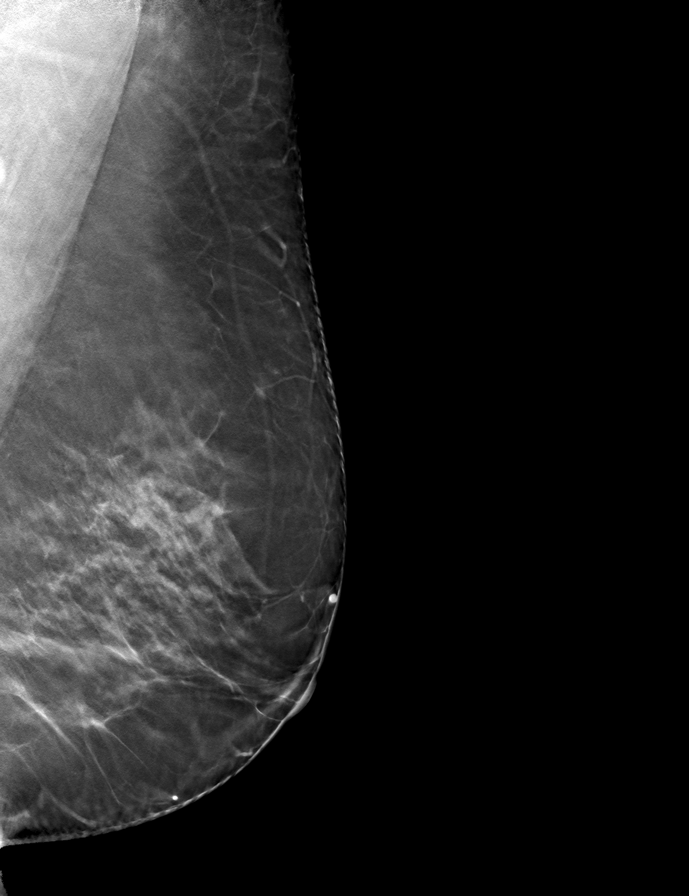

[L CC tomo · tomo slice 41/80.0]
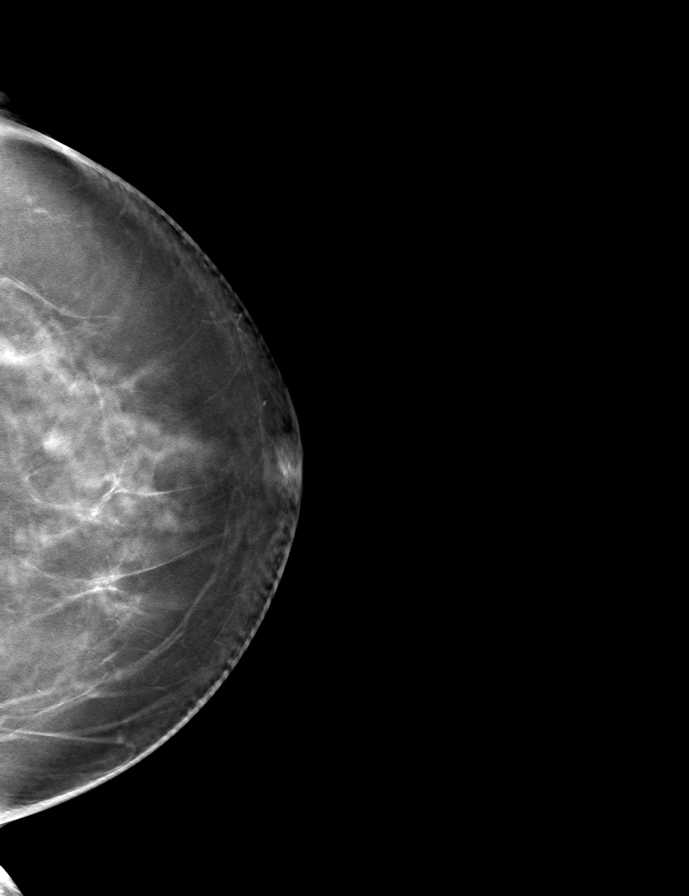

[R MLO tomo · tomo slice 39/76.0]
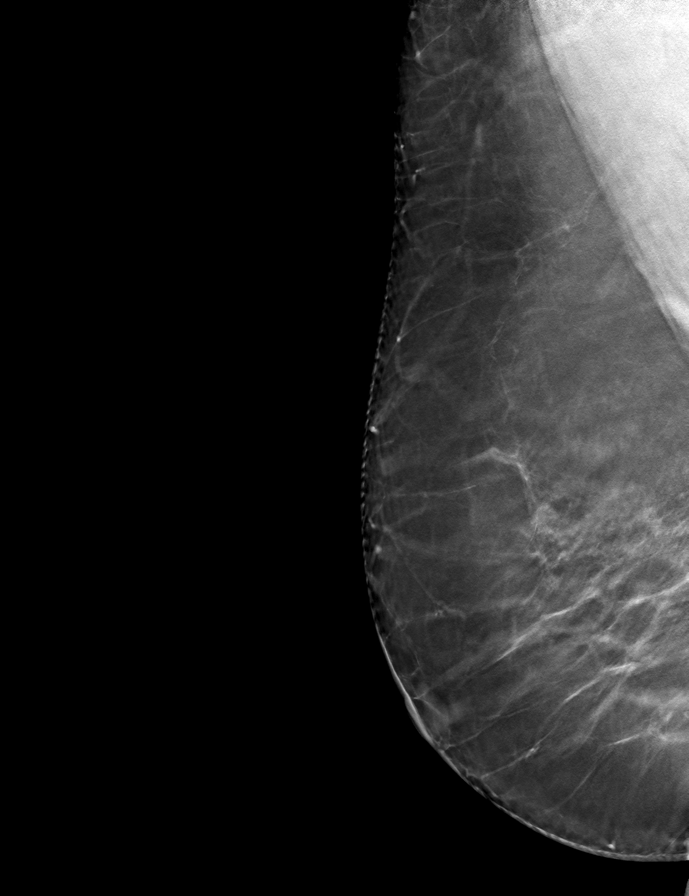

[8 of 24 positions shown; findings below may reference images not displayed]

ACR Breast Density Category b: There are scattered areas of
fibroglandular density.
FINDINGS: Mammographically stable circumscribed mass in the upper central left
breast. No mass, suspicious microcalcification, or architectural
distortion is identified in either breast to suggest malignancy.

Mammographic images were processed with CAD.

Targeted ultrasound is performed, showing a 0.8 x 0.4 x 0.7 cm oval
complicated cyst in the [DATE] position of the left breast 2 cm from
the nipple, stable. Also at 12 o'clock position 2 cm from the nipple
is a 0.4 x 0.4 x 0.3 cm mildly complicated cyst. This is slightly
decreased in size since Tuesday October, 2017.
IMPRESSION: Stable to slight decrease in benign complicated cysts in the left
breast. No evidence of malignancy in either breast.

RECOMMENDATION:
Screening mammogram in one year.(Code:P2-F-GO6)

I have discussed the findings and recommendations with the patient.
Results were also provided in writing at the conclusion of the
visit. If applicable, a reminder letter will be sent to the patient
regarding the next appointment.

BI-RADS CATEGORY  2: Benign.

## 2019-12-15 DIAGNOSIS — E119 Type 2 diabetes mellitus without complications: Secondary | ICD-10-CM | POA: Diagnosis not present

## 2019-12-15 DIAGNOSIS — M5430 Sciatica, unspecified side: Secondary | ICD-10-CM | POA: Diagnosis not present

## 2019-12-15 DIAGNOSIS — I10 Essential (primary) hypertension: Secondary | ICD-10-CM | POA: Diagnosis not present

## 2020-02-22 DIAGNOSIS — M545 Low back pain, unspecified: Secondary | ICD-10-CM | POA: Diagnosis not present

## 2020-02-22 DIAGNOSIS — M5442 Lumbago with sciatica, left side: Secondary | ICD-10-CM | POA: Diagnosis not present

## 2020-02-27 DIAGNOSIS — S336XXD Sprain of sacroiliac joint, subsequent encounter: Secondary | ICD-10-CM | POA: Diagnosis not present

## 2020-02-27 DIAGNOSIS — M6281 Muscle weakness (generalized): Secondary | ICD-10-CM | POA: Diagnosis not present

## 2020-03-14 ENCOUNTER — Other Ambulatory Visit: Payer: Self-pay | Admitting: Family Medicine

## 2020-03-14 DIAGNOSIS — Z1231 Encounter for screening mammogram for malignant neoplasm of breast: Secondary | ICD-10-CM

## 2020-04-23 ENCOUNTER — Ambulatory Visit
Admission: RE | Admit: 2020-04-23 | Discharge: 2020-04-23 | Disposition: A | Discharge status: Home / Self Care | Payer: BC Managed Care – PPO | Source: Ambulatory Visit | Attending: Family Medicine | Admitting: Family Medicine

## 2020-04-23 ENCOUNTER — Other Ambulatory Visit: Payer: Self-pay

## 2020-04-23 DIAGNOSIS — Z1231 Encounter for screening mammogram for malignant neoplasm of breast: Secondary | ICD-10-CM | POA: Diagnosis not present

## 2020-08-06 DIAGNOSIS — E119 Type 2 diabetes mellitus without complications: Secondary | ICD-10-CM | POA: Diagnosis not present

## 2020-08-06 DIAGNOSIS — Z0000 Encounter for general adult medical examination without abnormal findings: Secondary | ICD-10-CM | POA: Diagnosis not present

## 2020-08-06 DIAGNOSIS — Z13220 Encounter for screening for lipoid disorders: Secondary | ICD-10-CM | POA: Diagnosis not present

## 2020-08-06 DIAGNOSIS — I10 Essential (primary) hypertension: Secondary | ICD-10-CM | POA: Diagnosis not present

## 2020-08-29 DIAGNOSIS — J209 Acute bronchitis, unspecified: Secondary | ICD-10-CM | POA: Diagnosis not present

## 2020-12-02 DIAGNOSIS — M109 Gout, unspecified: Secondary | ICD-10-CM | POA: Diagnosis not present

## 2021-02-06 DIAGNOSIS — I10 Essential (primary) hypertension: Secondary | ICD-10-CM | POA: Diagnosis not present

## 2021-02-06 DIAGNOSIS — Z23 Encounter for immunization: Secondary | ICD-10-CM | POA: Diagnosis not present

## 2021-02-06 DIAGNOSIS — E119 Type 2 diabetes mellitus without complications: Secondary | ICD-10-CM | POA: Diagnosis not present

## 2021-08-07 DIAGNOSIS — I10 Essential (primary) hypertension: Secondary | ICD-10-CM | POA: Diagnosis not present

## 2021-08-07 DIAGNOSIS — E119 Type 2 diabetes mellitus without complications: Secondary | ICD-10-CM | POA: Diagnosis not present

## 2021-08-07 DIAGNOSIS — Z23 Encounter for immunization: Secondary | ICD-10-CM | POA: Diagnosis not present

## 2021-08-07 DIAGNOSIS — Z0000 Encounter for general adult medical examination without abnormal findings: Secondary | ICD-10-CM | POA: Diagnosis not present

## 2021-08-09 ENCOUNTER — Ambulatory Visit
Admission: RE | Admit: 2021-08-09 | Discharge: 2021-08-09 | Disposition: A | Discharge status: Home / Self Care | Payer: BC Managed Care – PPO | Source: Ambulatory Visit | Attending: Family Medicine | Admitting: Family Medicine

## 2021-08-09 ENCOUNTER — Other Ambulatory Visit: Payer: Self-pay | Admitting: Family Medicine

## 2021-08-09 DIAGNOSIS — Z1231 Encounter for screening mammogram for malignant neoplasm of breast: Secondary | ICD-10-CM

## 2021-09-19 DIAGNOSIS — R109 Unspecified abdominal pain: Secondary | ICD-10-CM | POA: Diagnosis not present

## 2021-09-19 DIAGNOSIS — R1032 Left lower quadrant pain: Secondary | ICD-10-CM | POA: Diagnosis not present

## 2021-09-19 DIAGNOSIS — K5792 Diverticulitis of intestine, part unspecified, without perforation or abscess without bleeding: Secondary | ICD-10-CM | POA: Diagnosis not present

## 2021-10-30 DIAGNOSIS — R1032 Left lower quadrant pain: Secondary | ICD-10-CM | POA: Diagnosis not present

## 2021-11-01 ENCOUNTER — Other Ambulatory Visit (HOSPITAL_COMMUNITY): Payer: Self-pay | Admitting: Family Medicine

## 2021-11-01 ENCOUNTER — Other Ambulatory Visit: Payer: Self-pay | Admitting: Family Medicine

## 2021-11-01 DIAGNOSIS — R1032 Left lower quadrant pain: Secondary | ICD-10-CM

## 2021-11-06 ENCOUNTER — Ambulatory Visit (HOSPITAL_COMMUNITY): Payer: BC Managed Care – PPO

## 2021-11-06 DIAGNOSIS — R1032 Left lower quadrant pain: Secondary | ICD-10-CM | POA: Diagnosis not present

## 2022-02-25 DIAGNOSIS — E119 Type 2 diabetes mellitus without complications: Secondary | ICD-10-CM | POA: Diagnosis not present

## 2022-02-25 DIAGNOSIS — Z23 Encounter for immunization: Secondary | ICD-10-CM | POA: Diagnosis not present

## 2022-02-25 DIAGNOSIS — I10 Essential (primary) hypertension: Secondary | ICD-10-CM | POA: Diagnosis not present

## 2022-03-28 DIAGNOSIS — D2262 Melanocytic nevi of left upper limb, including shoulder: Secondary | ICD-10-CM | POA: Diagnosis not present

## 2022-03-28 DIAGNOSIS — L821 Other seborrheic keratosis: Secondary | ICD-10-CM | POA: Diagnosis not present

## 2022-03-28 DIAGNOSIS — L820 Inflamed seborrheic keratosis: Secondary | ICD-10-CM | POA: Diagnosis not present

## 2022-03-28 DIAGNOSIS — D2261 Melanocytic nevi of right upper limb, including shoulder: Secondary | ICD-10-CM | POA: Diagnosis not present

## 2022-03-28 DIAGNOSIS — D2362 Other benign neoplasm of skin of left upper limb, including shoulder: Secondary | ICD-10-CM | POA: Diagnosis not present

## 2022-05-22 DIAGNOSIS — M545 Low back pain, unspecified: Secondary | ICD-10-CM | POA: Diagnosis not present

## 2022-05-27 ENCOUNTER — Other Ambulatory Visit (HOSPITAL_COMMUNITY): Payer: Self-pay | Admitting: Orthopedic Surgery

## 2022-05-27 ENCOUNTER — Ambulatory Visit (HOSPITAL_COMMUNITY)
Admission: RE | Admit: 2022-05-27 | Discharge: 2022-05-27 | Disposition: A | Discharge status: Home / Self Care | Payer: BC Managed Care – PPO | Source: Ambulatory Visit | Attending: Vascular Surgery | Admitting: Vascular Surgery

## 2022-05-27 DIAGNOSIS — M79605 Pain in left leg: Secondary | ICD-10-CM | POA: Insufficient documentation

## 2022-05-27 DIAGNOSIS — M25552 Pain in left hip: Secondary | ICD-10-CM | POA: Diagnosis not present

## 2022-05-30 DIAGNOSIS — M545 Low back pain, unspecified: Secondary | ICD-10-CM | POA: Diagnosis not present

## 2022-06-03 DIAGNOSIS — M5442 Lumbago with sciatica, left side: Secondary | ICD-10-CM | POA: Diagnosis not present

## 2022-06-03 DIAGNOSIS — M5416 Radiculopathy, lumbar region: Secondary | ICD-10-CM | POA: Diagnosis not present

## 2022-06-05 DIAGNOSIS — M5416 Radiculopathy, lumbar region: Secondary | ICD-10-CM | POA: Diagnosis not present

## 2022-06-09 DIAGNOSIS — M5416 Radiculopathy, lumbar region: Secondary | ICD-10-CM | POA: Diagnosis not present

## 2022-06-11 DIAGNOSIS — M5416 Radiculopathy, lumbar region: Secondary | ICD-10-CM | POA: Diagnosis not present

## 2022-06-18 DIAGNOSIS — M5416 Radiculopathy, lumbar region: Secondary | ICD-10-CM | POA: Diagnosis not present

## 2022-06-20 DIAGNOSIS — M5416 Radiculopathy, lumbar region: Secondary | ICD-10-CM | POA: Diagnosis not present

## 2022-06-23 DIAGNOSIS — M5416 Radiculopathy, lumbar region: Secondary | ICD-10-CM | POA: Diagnosis not present

## 2022-07-04 DIAGNOSIS — M5416 Radiculopathy, lumbar region: Secondary | ICD-10-CM | POA: Diagnosis not present

## 2022-07-07 ENCOUNTER — Other Ambulatory Visit: Payer: Self-pay | Admitting: Orthopedic Surgery

## 2022-08-01 NOTE — Progress Notes (Signed)
Surgical Instructions    Your procedure is scheduled on Tuesday, 08/12/22.  Report to [facility] Main Entrance "A" at 5:30 A.M., then check in with the Admitting office.  Call this number if you have problems the morning of surgery:  [phone]   If you have any questions prior to your surgery date call [phone]: Open Monday-Friday 8am-4pm If you experience any cold or flu symptoms such as cough, fever, chills, shortness of breath, etc. between now and your scheduled surgery, please notify us at the above number     Remember:  Do not eat after midnight the night before your surgery  You may drink clear liquids until 4:30am the morning of your surgery.   Clear liquids allowed are: Water, Non-Citrus Juices (without pulp), Carbonated Beverages, Clear Tea, Black Coffee ONLY (NO MILK, CREAM OR POWDERED CREAMER of any kind), and Gatorade  Patient Instructions  The night before surgery:  No food after midnight. ONLY clear liquids after midnight   The day of surgery (if you have diabetes): Drink ONE (1) 12 oz G2 given to you in your pre admission testing appointment by 4:30am the morning of surgery. Drink in one sitting. Do not sip.  This drink was given to you during your hospital  pre-op appointment visit.  Nothing else to drink after completing the  12 oz bottle of G2.         If you have questions, please contact your surgeon's office.     Take these medicines the morning of surgery with A SIP OF WATER:  atenolol (TENORMIN)    As of today, STOP taking any Aspirin (unless otherwise instructed by your surgeon) Aleve, Naproxen, Ibuprofen, Motrin, Advil, Goody's, BC's, all herbal medications, fish oil, and all vitamins.  WHAT DO I DO ABOUT MY DIABETES MEDICATION?   Do not take oral diabetes medicines (pills) the morning of surgery.  THE NIGHT BEFORE SURGERY, do not take glimepiride (AMARYL).    The day of surgery, do not take other diabetes injectables, including Byetta  (exenatide), Bydureon (exenatide ER), Victoza (liraglutide), or Trulicity (dulaglutide).  If your CBG is greater than 220 mg/dL, you may take  of your sliding scale (correction) dose of insulin.   HOW TO MANAGE YOUR DIABETES BEFORE AND AFTER SURGERY  Why is it important to control my blood sugar before and after surgery? Improving blood sugar levels before and after surgery helps healing and can limit problems. A way of improving blood sugar control is eating a healthy diet by:  Eating less sugar and carbohydrates  Increasing activity/exercise  Talking with your doctor about reaching your blood sugar goals High blood sugars (greater than 180 mg/dL) can raise your risk of infections and slow your recovery, so you will need to focus on controlling your diabetes during the weeks before surgery. Make sure that the doctor who takes care of your diabetes knows about your planned surgery including the date and location.  How do I manage my blood sugar before surgery? Check your blood sugar at least 4 times a day, starting 2 days before surgery, to make sure that the level is not too high or low.  Check your blood sugar the morning of your surgery when you wake up and every 2 hours until you get to the Short Stay unit.  If your blood sugar is less than 70 mg/dL, you will need to treat for low blood sugar: Do not take insulin. Treat a low blood sugar (less than 70 mg/dL) with  cup of clear juice (cranberry or apple), 4 glucose tablets, OR glucose gel. Recheck blood sugar in 15 minutes after treatment (to make sure it is greater than 70 mg/dL). If your blood sugar is not greater than 70 mg/dL on recheck, call [phone] for further instructions. Report your blood sugar to the short stay nurse when you get to Short Stay.  If you are admitted to the hospital after surgery: Your blood sugar will be checked by the staff and you will probably be given insulin after surgery (instead of oral diabetes  medicines) to make sure you have good blood sugar levels. The goal for blood sugar control after surgery is 80-180 mg/dL.            Do not wear jewelry or makeup. Do not wear lotions, powders, perfumes or deodorant. Do not shave 48 hours prior to surgery.   Do not bring valuables to the hospital. Do not wear nail polish, gel polish, artificial nails, or any other type of covering on natural nails (fingers and toes) If you have artificial nails or gel coating that need to be removed by a nail salon, please have this removed prior to surgery. Artificial nails or gel coating may interfere with anesthesia's ability to adequately monitor your vital signs.  [facility] is not responsible for any belongings or valuables.    Do NOT Smoke (Tobacco/Vaping)  24 hours prior to your procedure  If you use a CPAP at night, you may bring your mask for your overnight stay.   Contacts, glasses, hearing aids, dentures or partials may not be worn into surgery, please bring cases for these belongings   For patients admitted to the hospital, discharge time will be determined by your treatment team.   Patients discharged the day of surgery will not be allowed to drive home, and someone needs to stay with them for 24 hours.   SURGICAL WAITING ROOM VISITATION Patients having surgery or a procedure may have no more than 2 support people in the waiting area - these visitors may rotate.   Children under the age of 47 must have an adult with them who is not the patient. If the patient needs to stay at the hospital during part of their recovery, the visitor guidelines for inpatient rooms apply. Pre-op nurse will coordinate an appropriate time for 1 support person to accompany patient in pre-op.  This support person may not rotate.   Please refer to [email or web address] for the visitor guidelines for Inpatients (after your surgery is over and you are in a regular  room).    Special instructions:    Oral Hygiene is also important to reduce your risk of infection.  Remember - BRUSH YOUR TEETH THE MORNING OF SURGERY WITH YOUR REGULAR TOOTHPASTE   Sherri Thomas- Preparing For Surgery  Before surgery, you can play an important role. Because skin is not sterile, your skin needs to be as free of germs as possible. You can reduce the number of germs on your skin by washing with CHG (chlorahexidine gluconate) Soap before surgery.  CHG is an antiseptic cleaner which kills germs and bonds with the skin to continue killing germs even after washing.     Please do not use if you have an allergy to CHG or antibacterial soaps. If your skin becomes reddened/irritated stop using the CHG.  Do not shave (including legs and underarms) for at least 48 hours prior to first CHG shower. It is OK to shave your face.  Please  follow these instructions carefully.     Shower the NIGHT BEFORE SURGERY and the MORNING OF SURGERY with CHG Soap.   If you chose to wash your hair, wash your hair first as usual with your normal shampoo. After you shampoo, rinse your hair and body thoroughly to remove the shampoo.  Then [employer] and genitals (private parts) with your normal soap and rinse thoroughly to remove soap.  After that Use CHG Soap as you would any other liquid soap. You can apply CHG directly to the skin and wash gently with a scrungie or a clean washcloth.   Apply the CHG Soap to your body ONLY FROM THE NECK DOWN.  Do not use on open wounds or open sores. Avoid contact with your eyes, ears, mouth and genitals (private parts). Wash Face and genitals (private parts)  with your normal soap.   Wash thoroughly, paying special attention to the area where your surgery will be performed.  Thoroughly rinse your body with warm water from the neck down.  DO NOT shower/wash with your normal soap after using and rinsing off the CHG Soap.  Pat yourself dry with a CLEAN TOWEL.  Wear  CLEAN PAJAMAS to bed the night before surgery  Place CLEAN SHEETS on your bed the night before your surgery  DO NOT SLEEP WITH PETS.   Day of Surgery: Take a shower with CHG soap. Wear Clean/Comfortable clothing the morning of surgery Do not apply any deodorants/lotions.   Remember to brush your teeth WITH YOUR REGULAR TOOTHPASTE.    If you received a COVID test during your pre-op visit, it is requested that you wear a mask when out in public, stay away from anyone that may not be feeling well, and notify your surgeon if you develop symptoms. If you have been in contact with anyone that has tested positive in the last 10 days, please notify your surgeon.    Please read over the following fact sheets that you were given.

## 2022-08-04 ENCOUNTER — Encounter (HOSPITAL_COMMUNITY): Payer: Self-pay

## 2022-08-04 ENCOUNTER — Encounter (HOSPITAL_COMMUNITY)
Admission: RE | Admit: 2022-08-04 | Discharge: 2022-08-04 | Disposition: A | Discharge status: Home / Self Care | Payer: BC Managed Care – PPO | Source: Ambulatory Visit | Attending: Orthopedic Surgery | Admitting: Orthopedic Surgery

## 2022-08-04 ENCOUNTER — Other Ambulatory Visit: Payer: Self-pay

## 2022-08-04 VITALS — BP 130/92 | HR 66 | Temp 98.1°F | Resp 18 | Ht 62.0 in | Wt 175.7 lb

## 2022-08-04 DIAGNOSIS — I10 Essential (primary) hypertension: Secondary | ICD-10-CM | POA: Insufficient documentation

## 2022-08-04 DIAGNOSIS — Z01818 Encounter for other preprocedural examination: Secondary | ICD-10-CM | POA: Diagnosis not present

## 2022-08-04 DIAGNOSIS — M5416 Radiculopathy, lumbar region: Secondary | ICD-10-CM | POA: Diagnosis not present

## 2022-08-04 DIAGNOSIS — Z794 Long term (current) use of insulin: Secondary | ICD-10-CM | POA: Diagnosis not present

## 2022-08-04 DIAGNOSIS — E119 Type 2 diabetes mellitus without complications: Secondary | ICD-10-CM | POA: Diagnosis not present

## 2022-08-04 HISTORY — DX: Essential (primary) hypertension: I10

## 2022-08-04 HISTORY — DX: Type 2 diabetes mellitus without complications: E11.9

## 2022-08-04 LAB — TYPE AND SCREEN
ABO/RH(D): A NEG
Antibody Screen: NEGATIVE

## 2022-08-04 LAB — CBC
HCT: 42.3 % (ref 36.0–46.0)
Hemoglobin: 14.2 g/dL (ref 12.0–15.0)
MCH: 31.1 pg (ref 26.0–34.0)
MCHC: 33.6 g/dL (ref 30.0–36.0)
MCV: 92.6 fL (ref 80.0–100.0)
Platelets: 362 10*3/uL (ref 150–400)
RBC: 4.57 MIL/uL (ref 3.87–5.11)
RDW: 12.3 % (ref 11.5–15.5)
WBC: 7.9 10*3/uL (ref 4.0–10.5)
nRBC: 0 % (ref 0.0–0.2)

## 2022-08-04 LAB — BASIC METABOLIC PANEL
Anion gap: 9 (ref 5–15)
BUN: 14 mg/dL (ref 6–20)
CO2: 31 mmol/L (ref 22–32)
Calcium: 9.6 mg/dL (ref 8.9–10.3)
Chloride: 97 mmol/L — ABNORMAL LOW (ref 98–111)
Creatinine, Ser: 0.95 mg/dL (ref 0.44–1.00)
GFR, Estimated: >60 mL/min (ref >60)
Glucose, Bld: 163 mg/dL — ABNORMAL HIGH (ref 70–99)
Potassium: 4.1 mmol/L (ref 3.5–5.1)
Sodium: 137 mmol/L (ref 135–145)

## 2022-08-04 LAB — SURGICAL PCR SCREEN
MRSA, PCR: NEGATIVE
Staphylococcus aureus: NEGATIVE

## 2022-08-04 LAB — GLUCOSE, CAPILLARY: Glucose-Capillary: 172 mg/dL — ABNORMAL HIGH (ref 70–99)

## 2022-08-04 NOTE — Progress Notes (Signed)
PCP - Dr. Gaynelle Arabian Cardiologist - denies  PPM/ICD - n/a  Chest x-ray - n/a EKG - 08/03/32 Stress Test - denies ECHO - denies Cardiac Cath - denies  Sleep Study - denies CPAP - denies  Fasting Blood Sugar -105-125  Checks Blood Sugar 2 times a week, PRN for hypoglycemic symptoms.   Last dose of GLP1 agonist-  n/a GLP1 instructions: n/a  Blood Thinner Instructions: n/a Aspirin Instructions: pt stopped ASA on 08/01/22  ERAS Protcol -Clear liquids until 0430 DOS. PRE-SURGERY Ensure or G2- G2  COVID TEST- n/a  Anesthesia review: EKG review.  Patient denies shortness of breath, fever, cough and chest pain at PAT appointment   All instructions explained to the patient, with a verbal understanding of the material. Patient agrees to go over the instructions while at home for a better understanding. Patient also instructed to self quarantine after being tested for COVID-19. The opportunity to ask questions was provided.

## 2022-08-05 LAB — HEMOGLOBIN A1C
Hgb A1c MFr Bld: 6.6 % — ABNORMAL HIGH (ref 4.8–5.6)
Mean Plasma Glucose: 143 mg/dL

## 2022-08-11 NOTE — Anesthesia Preprocedure Evaluation (Signed)
Anesthesia Evaluation  Patient identified by MRN, date of birth, ID band Patient awake    Reviewed: Allergy & Precautions, NPO status , Patient's Chart, lab work & pertinent test results  Airway Mallampati: II  TM Distance: >3 FB Neck ROM: Full  Mouth opening: Limited Mouth Opening  Dental no notable dental hx. (+) Teeth Intact, Dental Advisory Given   Pulmonary neg pulmonary ROS   Pulmonary exam normal breath sounds clear to auscultation       Cardiovascular hypertension, Normal cardiovascular exam Rhythm:Regular Rate:Normal     Neuro/Psych negative neurological ROS  negative psych ROS   GI/Hepatic   Endo/Other  diabetes, Type 1    Renal/GU Lab Results      Component                Value               Date                      CREATININE               0.95                08/04/2022                BUN                      14                  08/04/2022                NA                       137                 08/04/2022                K                        4.1                 08/04/2022                CL                       97 (L)              08/04/2022                CO2                      31                  08/04/2022                Musculoskeletal   Abdominal   Peds  Hematology Lab Results      Component                Value               Date                      WBC                      7.9  08/04/2022                HGB                      14.2                08/04/2022                HCT                      42.3                08/04/2022                MCV                      92.6                08/04/2022                PLT                      362                 08/04/2022              Anesthesia Other Findings   Reproductive/Obstetrics                             Anesthesia Physical Anesthesia Plan  ASA: 3  Anesthesia Plan: General   Post-op  Pain Management: Ketamine IV*, Tylenol PO (pre-op)* and Dilaudid IV   Induction: Intravenous  PONV Risk Score and Plan: 4 or greater and Treatment may vary due to age or medical condition, Midazolam, Dexamethasone and Ondansetron  Airway Management Planned: Oral ETT  Additional Equipment: Arterial line  Intra-op Plan:   Post-operative Plan: Extubation in OR  Informed Consent: I have reviewed the patients History and Physical, chart, labs and discussed the procedure including the risks, benefits and alternatives for the proposed anesthesia with the patient or authorized representative who has indicated his/her understanding and acceptance.     Dental advisory given  Plan Discussed with: CRNA, Anesthesiologist and Surgeon  Anesthesia Plan Comments:         Anesthesia Quick Evaluation

## 2022-08-12 ENCOUNTER — Ambulatory Visit (HOSPITAL_COMMUNITY): Payer: BC Managed Care – PPO | Admitting: Anesthesiology

## 2022-08-12 ENCOUNTER — Ambulatory Visit (HOSPITAL_COMMUNITY): Payer: BC Managed Care – PPO

## 2022-08-12 ENCOUNTER — Other Ambulatory Visit: Payer: Self-pay

## 2022-08-12 ENCOUNTER — Encounter (HOSPITAL_COMMUNITY): Payer: Self-pay | Admitting: Orthopedic Surgery

## 2022-08-12 ENCOUNTER — Observation Stay (HOSPITAL_COMMUNITY)
Admission: RE | Admit: 2022-08-12 | Discharge: 2022-08-13 | Disposition: A | Discharge status: Home / Self Care | Payer: BC Managed Care – PPO | Attending: Orthopedic Surgery | Admitting: Orthopedic Surgery

## 2022-08-12 ENCOUNTER — Encounter (HOSPITAL_COMMUNITY)
Admission: RE | Disposition: A | Discharge status: Home / Self Care | Payer: Self-pay | Source: Home / Self Care | Attending: Orthopedic Surgery

## 2022-08-12 ENCOUNTER — Ambulatory Visit (HOSPITAL_COMMUNITY): Payer: BC Managed Care – PPO | Admitting: Physician Assistant

## 2022-08-12 DIAGNOSIS — E109 Type 1 diabetes mellitus without complications: Secondary | ICD-10-CM | POA: Diagnosis not present

## 2022-08-12 DIAGNOSIS — M5116 Intervertebral disc disorders with radiculopathy, lumbar region: Secondary | ICD-10-CM | POA: Diagnosis not present

## 2022-08-12 DIAGNOSIS — Z79899 Other long term (current) drug therapy: Secondary | ICD-10-CM | POA: Diagnosis not present

## 2022-08-12 DIAGNOSIS — Z7984 Long term (current) use of oral hypoglycemic drugs: Secondary | ICD-10-CM | POA: Insufficient documentation

## 2022-08-12 DIAGNOSIS — Z981 Arthrodesis status: Secondary | ICD-10-CM | POA: Diagnosis not present

## 2022-08-12 DIAGNOSIS — E119 Type 2 diabetes mellitus without complications: Secondary | ICD-10-CM | POA: Insufficient documentation

## 2022-08-12 DIAGNOSIS — M48061 Spinal stenosis, lumbar region without neurogenic claudication: Principal | ICD-10-CM | POA: Insufficient documentation

## 2022-08-12 DIAGNOSIS — I10 Essential (primary) hypertension: Secondary | ICD-10-CM | POA: Insufficient documentation

## 2022-08-12 DIAGNOSIS — Z794 Long term (current) use of insulin: Secondary | ICD-10-CM

## 2022-08-12 DIAGNOSIS — Z7982 Long term (current) use of aspirin: Secondary | ICD-10-CM | POA: Insufficient documentation

## 2022-08-12 DIAGNOSIS — Z4789 Encounter for other orthopedic aftercare: Secondary | ICD-10-CM | POA: Diagnosis not present

## 2022-08-12 DIAGNOSIS — M5126 Other intervertebral disc displacement, lumbar region: Secondary | ICD-10-CM | POA: Diagnosis not present

## 2022-08-12 DIAGNOSIS — M5416 Radiculopathy, lumbar region: Secondary | ICD-10-CM | POA: Diagnosis present

## 2022-08-12 HISTORY — PX: TRANSFORAMINAL LUMBAR INTERBODY FUSION (TLIF) WITH PEDICLE SCREW FIXATION 1 LEVEL: SHX6141

## 2022-08-12 LAB — GLUCOSE, CAPILLARY
Glucose-Capillary: 141 mg/dL — ABNORMAL HIGH (ref 70–99)
Glucose-Capillary: 151 mg/dL — ABNORMAL HIGH (ref 70–99)
Glucose-Capillary: 215 mg/dL — ABNORMAL HIGH (ref 70–99)
Glucose-Capillary: 191 mg/dL — ABNORMAL HIGH (ref 70–99)
Glucose-Capillary: 218 mg/dL — ABNORMAL HIGH (ref 70–99)

## 2022-08-12 LAB — ABO/RH: ABO/RH(D): A NEG

## 2022-08-12 SURGERY — TRANSFORAMINAL LUMBAR INTERBODY FUSION (TLIF) WITH PEDICLE SCREW FIXATION 1 LEVEL
Anesthesia: General | Site: Spine Lumbar | Laterality: Left

## 2022-08-12 MED ORDER — BISACODYL 5 MG PO TBEC
5.0000 mg | DELAYED_RELEASE_TABLET | Freq: Every day | ORAL | Status: DC | PRN
  Administered 2022-08-13: 5 mg via ORAL
  Filled 2022-08-12: qty 1

## 2022-08-12 MED ORDER — BUPIVACAINE LIPOSOME 1.3 % IJ SUSP
INTRAMUSCULAR | Status: DC | PRN
  Administered 2022-08-12: 20 mL

## 2022-08-12 MED ORDER — ACETAMINOPHEN 10 MG/ML IV SOLN
INTRAVENOUS | Status: AC
  Filled 2022-08-12: qty 100

## 2022-08-12 MED ORDER — MORPHINE SULFATE (PF) 2 MG/ML IV SOLN: 1.0000 mg | INTRAVENOUS | Status: DC | PRN

## 2022-08-12 MED ORDER — INSULIN ASPART 100 UNIT/ML IJ SOLN
4.0000 [IU] | Freq: Once | INTRAMUSCULAR | Status: AC
  Administered 2022-08-12: 4 [IU] via SUBCUTANEOUS

## 2022-08-12 MED ORDER — CHLORTHALIDONE 25 MG PO TABS: 25.0000 mg | ORAL_TABLET | Freq: Every day | ORAL | Status: DC

## 2022-08-12 MED ORDER — AMISULPRIDE (ANTIEMETIC) 5 MG/2ML IV SOLN
10.0000 mg | Freq: Once | INTRAVENOUS | Status: AC | PRN
  Administered 2022-08-12: 10 mg via INTRAVENOUS

## 2022-08-12 MED ORDER — GLIMEPIRIDE 1 MG PO TABS
1.0000 mg | ORAL_TABLET | Freq: Every day | ORAL | Status: DC
  Administered 2022-08-13: 1 mg via ORAL
  Filled 2022-08-12: qty 1

## 2022-08-12 MED ORDER — METHOCARBAMOL 500 MG PO TABS
500.0000 mg | ORAL_TABLET | Freq: Four times a day (QID) | ORAL | Status: DC | PRN
  Administered 2022-08-12 (×2): 500 mg via ORAL
  Filled 2022-08-12 (×2): qty 1

## 2022-08-12 MED ORDER — BUPIVACAINE LIPOSOME 1.3 % IJ SUSP
INTRAMUSCULAR | Status: AC
  Filled 2022-08-12: qty 20

## 2022-08-12 MED ORDER — OXYCODONE-ACETAMINOPHEN 5-325 MG PO TABS
1.0000 | ORAL_TABLET | ORAL | Status: DC | PRN
  Administered 2022-08-12 (×2): 2 via ORAL
  Administered 2022-08-12: 1 via ORAL
  Administered 2022-08-13 (×2): 2 via ORAL
  Filled 2022-08-12 (×5): qty 2

## 2022-08-12 MED ORDER — SUGAMMADEX SODIUM 200 MG/2ML IV SOLN
INTRAVENOUS | Status: DC | PRN
  Administered 2022-08-12: 200 mg via INTRAVENOUS

## 2022-08-12 MED ORDER — FENTANYL CITRATE (PF) 250 MCG/5ML IJ SOLN
INTRAMUSCULAR | Status: DC | PRN
  Administered 2022-08-12 (×4): 50 ug via INTRAVENOUS

## 2022-08-12 MED ORDER — ZOLPIDEM TARTRATE 5 MG PO TABS: 5.0000 mg | ORAL_TABLET | Freq: Every evening | ORAL | Status: DC | PRN

## 2022-08-12 MED ORDER — EPHEDRINE SULFATE-NACL 50-0.9 MG/10ML-% IV SOSY
PREFILLED_SYRINGE | INTRAVENOUS | Status: DC | PRN
  Administered 2022-08-12 (×5): 5 mg via INTRAVENOUS

## 2022-08-12 MED ORDER — ALUM & MAG HYDROXIDE-SIMETH 200-200-20 MG/5ML PO SUSP: 30.0000 mL | Freq: Four times a day (QID) | ORAL | Status: DC | PRN

## 2022-08-12 MED ORDER — HYDROCODONE-ACETAMINOPHEN 5-325 MG PO TABS
1.0000 | ORAL_TABLET | ORAL | Status: DC | PRN
  Administered 2022-08-13: 2 via ORAL
  Filled 2022-08-12: qty 2

## 2022-08-12 MED ORDER — THROMBIN 20000 UNITS EX SOLR: CUTANEOUS | Status: DC | PRN

## 2022-08-12 MED ORDER — ONDANSETRON HCL 4 MG/2ML IJ SOLN
INTRAMUSCULAR | Status: DC | PRN
  Administered 2022-08-12: 4 mg via INTRAVENOUS

## 2022-08-12 MED ORDER — GLYCOPYRROLATE PF 0.2 MG/ML IJ SOSY
PREFILLED_SYRINGE | INTRAMUSCULAR | Status: DC | PRN
  Administered 2022-08-12: .1 mg via INTRAVENOUS

## 2022-08-12 MED ORDER — SENNOSIDES-DOCUSATE SODIUM 8.6-50 MG PO TABS: 1.0000 | ORAL_TABLET | Freq: Every evening | ORAL | Status: DC | PRN

## 2022-08-12 MED ORDER — ACETAMINOPHEN 650 MG RE SUPP: 650.0000 mg | RECTAL | Status: DC | PRN

## 2022-08-12 MED ORDER — LIDOCAINE 2% (20 MG/ML) 5 ML SYRINGE
INTRAMUSCULAR | Status: AC
  Filled 2022-08-12: qty 5

## 2022-08-12 MED ORDER — SODIUM CHLORIDE 0.9 % IV SOLN
250.0000 mL | INTRAVENOUS | Status: DC
  Administered 2022-08-12: 250 mL via INTRAVENOUS

## 2022-08-12 MED ORDER — ONDANSETRON HCL 4 MG/2ML IJ SOLN
INTRAMUSCULAR | Status: AC
  Filled 2022-08-12: qty 2

## 2022-08-12 MED ORDER — THROMBIN 20000 UNITS EX SOLR
CUTANEOUS | Status: AC
  Filled 2022-08-12: qty 20000

## 2022-08-12 MED ORDER — MIDAZOLAM HCL 2 MG/2ML IJ SOLN
INTRAMUSCULAR | Status: AC
  Filled 2022-08-12: qty 2

## 2022-08-12 MED ORDER — ONDANSETRON HCL 4 MG PO TABS: 4.0000 mg | ORAL_TABLET | Freq: Four times a day (QID) | ORAL | Status: DC | PRN

## 2022-08-12 MED ORDER — POTASSIUM CHLORIDE IN NACL 20-0.9 MEQ/L-% IV SOLN: INTRAVENOUS | Status: DC

## 2022-08-12 MED ORDER — INSULIN ASPART 100 UNIT/ML IJ SOLN
INTRAMUSCULAR | Status: AC
  Filled 2022-08-12: qty 1

## 2022-08-12 MED ORDER — PHENOL 1.4 % MT LIQD: 1.0000 | OROMUCOSAL | Status: DC | PRN

## 2022-08-12 MED ORDER — LACTATED RINGERS IV SOLN: INTRAVENOUS | Status: DC

## 2022-08-12 MED ORDER — DEXAMETHASONE SODIUM PHOSPHATE 10 MG/ML IJ SOLN
INTRAMUSCULAR | Status: AC
  Filled 2022-08-12: qty 1

## 2022-08-12 MED ORDER — OXYCODONE HCL 5 MG PO TABS: 5.0000 mg | ORAL_TABLET | Freq: Once | ORAL | Status: DC | PRN

## 2022-08-12 MED ORDER — SODIUM CHLORIDE 0.9% FLUSH: 3.0000 mL | INTRAVENOUS | Status: DC | PRN

## 2022-08-12 MED ORDER — MINERAL OIL LIGHT 100 % EX OIL
TOPICAL_OIL | CUTANEOUS | Status: DC | PRN
  Administered 2022-08-12: 1 via TOPICAL

## 2022-08-12 MED ORDER — DOCUSATE SODIUM 100 MG PO CAPS
100.0000 mg | ORAL_CAPSULE | Freq: Two times a day (BID) | ORAL | Status: DC
  Administered 2022-08-12: 100 mg via ORAL
  Filled 2022-08-12: qty 1

## 2022-08-12 MED ORDER — DEXAMETHASONE SODIUM PHOSPHATE 10 MG/ML IJ SOLN
INTRAMUSCULAR | Status: DC | PRN
  Administered 2022-08-12: 5 mg via INTRAVENOUS

## 2022-08-12 MED ORDER — POVIDONE-IODINE 7.5 % EX SOLN
Freq: Once | CUTANEOUS | Status: DC
  Filled 2022-08-12: qty 118

## 2022-08-12 MED ORDER — ADULT MULTIVITAMIN W/MINERALS CH
1.0000 | ORAL_TABLET | Freq: Every day | ORAL | Status: DC
  Filled 2022-08-12: qty 1

## 2022-08-12 MED ORDER — PROPOFOL 10 MG/ML IV BOLUS
INTRAVENOUS | Status: AC
  Filled 2022-08-12: qty 20

## 2022-08-12 MED ORDER — METHOCARBAMOL 1000 MG/10ML IJ SOLN: 500.0000 mg | Freq: Four times a day (QID) | INTRAVENOUS | Status: DC | PRN

## 2022-08-12 MED ORDER — METFORMIN HCL ER 500 MG PO TB24
1000.0000 mg | ORAL_TABLET | Freq: Every day | ORAL | Status: DC
  Administered 2022-08-12: 1000 mg via ORAL
  Filled 2022-08-12: qty 2

## 2022-08-12 MED ORDER — BUPIVACAINE-EPINEPHRINE (PF) 0.25% -1:200000 IJ SOLN
INTRAMUSCULAR | Status: AC
  Filled 2022-08-12: qty 30

## 2022-08-12 MED ORDER — ONDANSETRON HCL 4 MG/2ML IJ SOLN: 4.0000 mg | Freq: Once | INTRAMUSCULAR | Status: DC | PRN

## 2022-08-12 MED ORDER — CHLORHEXIDINE GLUCONATE 0.12 % MT SOLN
15.0000 mL | Freq: Once | OROMUCOSAL | Status: AC
  Administered 2022-08-12: 15 mL via OROMUCOSAL
  Filled 2022-08-12: qty 15

## 2022-08-12 MED ORDER — BUPIVACAINE-EPINEPHRINE 0.25% -1:200000 IJ SOLN
INTRAMUSCULAR | Status: DC | PRN
  Administered 2022-08-12: 30 mL

## 2022-08-12 MED ORDER — HYDROMORPHONE HCL 1 MG/ML IJ SOLN
INTRAMUSCULAR | Status: AC
  Filled 2022-08-12: qty 1

## 2022-08-12 MED ORDER — METHYLENE BLUE 1 % INJ SOLN
INTRAVENOUS | Status: AC
  Filled 2022-08-12: qty 10

## 2022-08-12 MED ORDER — B-12 500 MCG PO TABS: ORAL_TABLET | Freq: Every day | ORAL | Status: DC

## 2022-08-12 MED ORDER — 0.9 % SODIUM CHLORIDE (POUR BTL) OPTIME
TOPICAL | Status: DC | PRN
  Administered 2022-08-12 (×2): 1000 mL

## 2022-08-12 MED ORDER — MENTHOL 3 MG MT LOZG: 1.0000 | LOZENGE | OROMUCOSAL | Status: DC | PRN

## 2022-08-12 MED ORDER — HYDROMORPHONE HCL 1 MG/ML IJ SOLN
0.2500 mg | INTRAMUSCULAR | Status: DC | PRN
  Administered 2022-08-12: 0.5 mg via INTRAVENOUS
  Administered 2022-08-12: 0.25 mg via INTRAVENOUS

## 2022-08-12 MED ORDER — ATENOLOL 50 MG PO TABS: 50.0000 mg | ORAL_TABLET | Freq: Every day | ORAL | Status: DC

## 2022-08-12 MED ORDER — ONDANSETRON HCL 4 MG/2ML IJ SOLN
4.0000 mg | Freq: Four times a day (QID) | INTRAMUSCULAR | Status: DC | PRN
  Administered 2022-08-12: 4 mg via INTRAVENOUS
  Filled 2022-08-12: qty 2

## 2022-08-12 MED ORDER — ACETAMINOPHEN 10 MG/ML IV SOLN
1000.0000 mg | Freq: Once | INTRAVENOUS | Status: DC | PRN
  Administered 2022-08-12: 1000 mg via INTRAVENOUS

## 2022-08-12 MED ORDER — HYDROXYZINE HCL 50 MG/ML IM SOLN
50.0000 mg | Freq: Four times a day (QID) | INTRAMUSCULAR | Status: DC | PRN
  Administered 2022-08-12: 50 mg via INTRAMUSCULAR
  Filled 2022-08-12 (×2): qty 1

## 2022-08-12 MED ORDER — CEFAZOLIN SODIUM-DEXTROSE 2-4 GM/100ML-% IV SOLN
2.0000 g | Freq: Three times a day (TID) | INTRAVENOUS | Status: AC
  Administered 2022-08-12 (×2): 2 g via INTRAVENOUS
  Filled 2022-08-12 (×2): qty 100

## 2022-08-12 MED ORDER — CEFAZOLIN SODIUM-DEXTROSE 2-4 GM/100ML-% IV SOLN
2.0000 g | INTRAVENOUS | Status: AC
  Administered 2022-08-12: 2 g via INTRAVENOUS
  Filled 2022-08-12: qty 100

## 2022-08-12 MED ORDER — PROPOFOL 10 MG/ML IV BOLUS
INTRAVENOUS | Status: DC | PRN
  Administered 2022-08-12: 150 mg via INTRAVENOUS

## 2022-08-12 MED ORDER — AMISULPRIDE (ANTIEMETIC) 5 MG/2ML IV SOLN
INTRAVENOUS | Status: AC
  Filled 2022-08-12: qty 4

## 2022-08-12 MED ORDER — SODIUM CHLORIDE 0.9% FLUSH
3.0000 mL | Freq: Two times a day (BID) | INTRAVENOUS | Status: DC
  Administered 2022-08-12: 3 mL via INTRAVENOUS

## 2022-08-12 MED ORDER — LIDOCAINE 2% (20 MG/ML) 5 ML SYRINGE
INTRAMUSCULAR | Status: DC | PRN
  Administered 2022-08-12: 40 mg via INTRAVENOUS

## 2022-08-12 MED ORDER — OXYCODONE HCL 5 MG/5ML PO SOLN: 5.0000 mg | Freq: Once | ORAL | Status: DC | PRN

## 2022-08-12 MED ORDER — ROCURONIUM BROMIDE 10 MG/ML (PF) SYRINGE
PREFILLED_SYRINGE | INTRAVENOUS | Status: DC | PRN
  Administered 2022-08-12: 20 mg via INTRAVENOUS
  Administered 2022-08-12: 30 mg via INTRAVENOUS
  Administered 2022-08-12: 50 mg via INTRAVENOUS

## 2022-08-12 MED ORDER — FENTANYL CITRATE (PF) 250 MCG/5ML IJ SOLN
INTRAMUSCULAR | Status: AC
  Filled 2022-08-12: qty 5

## 2022-08-12 MED ORDER — ORAL CARE MOUTH RINSE: 15.0000 mL | Freq: Once | OROMUCOSAL | Status: AC

## 2022-08-12 MED ORDER — INSULIN ASPART 100 UNIT/ML IJ SOLN
0.0000 [IU] | INTRAMUSCULAR | Status: AC | PRN
  Administered 2022-08-12: 2 [IU] via SUBCUTANEOUS

## 2022-08-12 MED ORDER — ROCURONIUM BROMIDE 10 MG/ML (PF) SYRINGE
PREFILLED_SYRINGE | INTRAVENOUS | Status: AC
  Filled 2022-08-12: qty 10

## 2022-08-12 MED ORDER — FLEET ENEMA 7-19 GM/118ML RE ENEM: 1.0000 | ENEMA | Freq: Once | RECTAL | Status: DC | PRN

## 2022-08-12 MED ORDER — ACETAMINOPHEN 325 MG PO TABS: 650.0000 mg | ORAL_TABLET | ORAL | Status: DC | PRN

## 2022-08-12 MED ORDER — MIDAZOLAM HCL 2 MG/2ML IJ SOLN
INTRAMUSCULAR | Status: DC | PRN
  Administered 2022-08-12: 2 mg via INTRAVENOUS

## 2022-08-12 SURGICAL SUPPLY — 93 items
AGENT HMST KT MTR STRL THRMB (HEMOSTASIS)
APL SKNCLS STERI-STRIP NONHPOA (GAUZE/BANDAGES/DRESSINGS) ×1
BAG COUNTER SPONGE SURGICOUNT (BAG) ×1 IMPLANT
BAG SPNG CNTER NS LX DISP (BAG) ×1
BENZOIN TINCTURE PRP APPL 2/3 (GAUZE/BANDAGES/DRESSINGS) ×1 IMPLANT
BLADE CLIPPER SURG (BLADE) IMPLANT
BUR PRESCISION 1.7 ELITE (BURR) ×1 IMPLANT
BUR ROUND FLUTED 5 RND (BURR) ×1 IMPLANT
BUR ROUND PRECISION 4.0 (BURR) IMPLANT
BUR SABER RD CUTTING 3.0 (BURR) IMPLANT
CAGE SABLE 10X26 6-12 8D (Cage) IMPLANT
CANNULA GRAFT BNE VG PRE-FILL (Bone Implant) IMPLANT
CNTNR URN SCR LID CUP LEK RST (MISCELLANEOUS) ×1 IMPLANT
CONT SPEC 4OZ STRL OR WHT (MISCELLANEOUS) ×1
COVER BACK TABLE 60X90IN (DRAPES) ×1 IMPLANT
COVER MAYO STAND STRL (DRAPES) ×2 IMPLANT
COVER SURGICAL LIGHT HANDLE (MISCELLANEOUS) ×1 IMPLANT
DISPENSER GRAFT BNE VG (MISCELLANEOUS) IMPLANT
DISPENSER VIVIGEN BONE GRAFT (MISCELLANEOUS) ×1 IMPLANT
DRAIN CHANNEL 15F RND FF W/TCR (WOUND CARE) IMPLANT
DRAPE C-ARM 42X72 X-RAY (DRAPES) ×1 IMPLANT
DRAPE C-ARMOR (DRAPES) IMPLANT
DRAPE POUCH INSTRU U-SHP 10X18 (DRAPES) ×1 IMPLANT
DRAPE SURG 17X23 STRL (DRAPES) ×4 IMPLANT
DURAPREP 26ML APPLICATOR (WOUND CARE) ×1 IMPLANT
ELECT BLADE 4.0 EZ CLEAN MEGAD (MISCELLANEOUS) ×1
ELECT CAUTERY BLADE 6.4 (BLADE) ×1 IMPLANT
ELECT REM PT RETURN 9FT ADLT (ELECTROSURGICAL) ×1
ELECTRODE BLDE 4.0 EZ CLN MEGD (MISCELLANEOUS) ×1 IMPLANT
ELECTRODE REM PT RTRN 9FT ADLT (ELECTROSURGICAL) ×1 IMPLANT
EVACUATOR SILICONE 100CC (DRAIN) IMPLANT
FILTER STRAW FLUID ASPIR (MISCELLANEOUS) ×1 IMPLANT
GAUZE 4X4 16PLY ~~LOC~~+RFID DBL (SPONGE) ×1 IMPLANT
GAUZE SPONGE 4X4 12PLY STRL (GAUZE/BANDAGES/DRESSINGS) ×1 IMPLANT
GLOVE BIO SURGEON STRL SZ7 (GLOVE) ×1 IMPLANT
GLOVE BIO SURGEON STRL SZ8 (GLOVE) ×1 IMPLANT
GLOVE BIOGEL PI IND STRL 7.0 (GLOVE) ×1 IMPLANT
GLOVE BIOGEL PI IND STRL 8 (GLOVE) ×1 IMPLANT
GLOVE SURG ENC MOIS LTX SZ6.5 (GLOVE) ×1 IMPLANT
GOWN STRL REUS W/ TWL LRG LVL3 (GOWN DISPOSABLE) ×2 IMPLANT
GOWN STRL REUS W/ TWL XL LVL3 (GOWN DISPOSABLE) ×1 IMPLANT
GOWN STRL REUS W/TWL LRG LVL3 (GOWN DISPOSABLE) ×2
GOWN STRL REUS W/TWL XL LVL3 (GOWN DISPOSABLE) ×1
GRAFT BONE CANNULA VIVIGEN 3 (Bone Implant) ×2 IMPLANT
IV CATH 14GX2 1/4 (CATHETERS) ×1 IMPLANT
KIT BASIN OR (CUSTOM PROCEDURE TRAY) ×1 IMPLANT
KIT POSITION SURG JACKSON T1 (MISCELLANEOUS) ×1 IMPLANT
KIT TURNOVER KIT B (KITS) ×1 IMPLANT
MARKER SKIN DUAL TIP RULER LAB (MISCELLANEOUS) ×2 IMPLANT
NDL 18GX1X1/2 (RX/OR ONLY) (NEEDLE) ×1 IMPLANT
NDL 22X1.5 STRL (OR ONLY) (MISCELLANEOUS) ×2 IMPLANT
NDL HYPO 25GX1X1/2 BEV (NEEDLE) ×1 IMPLANT
NDL SPNL 18GX3.5 QUINCKE PK (NEEDLE) ×2 IMPLANT
NEEDLE 18GX1X1/2 (RX/OR ONLY) (NEEDLE) IMPLANT
NEEDLE 22X1.5 STRL (OR ONLY) (MISCELLANEOUS) ×1 IMPLANT
NEEDLE HYPO 25GX1X1/2 BEV (NEEDLE) ×1 IMPLANT
NEEDLE SPNL 18GX3.5 QUINCKE PK (NEEDLE) ×3 IMPLANT
NS IRRIG 1000ML POUR BTL (IV SOLUTION) ×1 IMPLANT
PACK LAMINECTOMY ORTHO (CUSTOM PROCEDURE TRAY) ×1 IMPLANT
PACK UNIVERSAL I (CUSTOM PROCEDURE TRAY) ×1 IMPLANT
PAD ARMBOARD 7.5X6 YLW CONV (MISCELLANEOUS) ×2 IMPLANT
PATTIES SURGICAL .5 X1 (DISPOSABLE) ×1 IMPLANT
PATTIES SURGICAL .5X1.5 (GAUZE/BANDAGES/DRESSINGS) ×1 IMPLANT
PUTTY DBX 2.5CC (Putty) ×1 IMPLANT
PUTTY DBX 2.5CC DEPUY (Putty) IMPLANT
ROD PRE BENT EXP 40MM (Rod) IMPLANT
ROD PRE LORDOSED 5.5X45 (Rod) IMPLANT
SCREW SET SINGLE INNER (Screw) IMPLANT
SCREW VIPER CORT FIX 5.00X35 (Screw) IMPLANT
SCREW VIPER CORT FIX 6X35 (Screw) IMPLANT
SPONGE INTESTINAL PEANUT (DISPOSABLE) ×1 IMPLANT
SPONGE SURGIFOAM ABS GEL 100 (HEMOSTASIS) ×1 IMPLANT
STRIP CLOSURE SKIN 1/2X4 (GAUZE/BANDAGES/DRESSINGS) ×2 IMPLANT
STRIP CLOSURE SKIN 1/4X4 (GAUZE/BANDAGES/DRESSINGS) IMPLANT
SURGIFLO W/THROMBIN 8M KIT (HEMOSTASIS) IMPLANT
SUT MNCRL AB 4-0 PS2 18 (SUTURE) ×1 IMPLANT
SUT VIC AB 0 CT1 18XCR BRD 8 (SUTURE) ×1 IMPLANT
SUT VIC AB 0 CT1 8-18 (SUTURE) ×1
SUT VIC AB 1 CT1 18XCR BRD 8 (SUTURE) ×1 IMPLANT
SUT VIC AB 1 CT1 8-18 (SUTURE) ×1
SUT VIC AB 2-0 CT2 18 VCP726D (SUTURE) ×1 IMPLANT
SYR 20ML LL LF (SYRINGE) ×2 IMPLANT
SYR BULB IRRIG 60ML STRL (SYRINGE) ×1 IMPLANT
SYR CONTROL 10ML LL (SYRINGE) ×2 IMPLANT
SYR TB 1ML LUER SLIP (SYRINGE) ×1 IMPLANT
TAP EXPEDIUM DL 4.35 (INSTRUMENTS) IMPLANT
TAP EXPEDIUM DL 5.0 (INSTRUMENTS) IMPLANT
TAP EXPEDIUM DL 6.0 (INSTRUMENTS) IMPLANT
TAPE CLOTH SOFT 2X10 (GAUZE/BANDAGES/DRESSINGS) IMPLANT
TRAY FOLEY MTR SLVR 16FR STAT (SET/KITS/TRAYS/PACK) ×1 IMPLANT
TUBE FUNNEL GL DISP (ORTHOPEDIC DISPOSABLE SUPPLIES) IMPLANT
WATER STERILE IRR 1000ML POUR (IV SOLUTION) ×1 IMPLANT
YANKAUER SUCT BULB TIP NO VENT (SUCTIONS) ×1 IMPLANT

## 2022-08-12 NOTE — Anesthesia Procedure Notes (Signed)
Procedure Name: Intubation Date/Time: 08/12/2022 7:46 AM  Performed by: Michele Rockers, CRNAPre-anesthesia Checklist: Patient identified, Patient being monitored, Timeout performed, Emergency Drugs available and Suction available Patient Re-evaluated:Patient Re-evaluated prior to induction Oxygen Delivery Method: Circle System Utilized Preoxygenation: Pre-oxygenation with 100% oxygen Induction Type: IV induction Ventilation: Mask ventilation without difficulty and Oral airway inserted - appropriate to patient size Laryngoscope Size: Sabra Heck and 2 Grade View: Grade II Tube type: Oral Tube size: 7.0 mm Number of attempts: 1 Airway Equipment and Method: Stylet Placement Confirmation: ETT inserted through vocal cords under direct vision, positive ETCO2 and breath sounds checked- equal and bilateral Secured at: 21 cm Tube secured with: Tape Dental Injury: Teeth and Oropharynx as per pre-operative assessment

## 2022-08-12 NOTE — H&P (Signed)
PREOPERATIVE H&P  Chief Complaint: Left leg pain and weakness  HPI: Sherri Thomas is a 57 y.o. female who presents with ongoing pain in the left leg  MRI reveals a large left L2/3 disc herniation, compressing the left L2 and L3 nerves  Patient has failed multiple forms of conservative care and continues to have pain (see office notes for additional details regarding the patient's full course of treatment)  Past Medical History:  Diagnosis Date   Diabetes mellitus without complication    Hypertension    Peripheral vascular disease    Past Surgical History:  Procedure Laterality Date   GALLBLADDER SURGERY     NECK SURGERY     for scoliosis at the age of 30.   Social History   Socioeconomic History   Marital status: Married    Spouse name: Not on file   Number of children: Not on file   Years of education: Not on file   Highest education level: Not on file  Occupational History   Not on file  Tobacco Use   Smoking status: Never   Smokeless tobacco: Never  Vaping Use   Vaping Use: Never used  Substance and Sexual Activity   Alcohol use: No   Drug use: No   Sexual activity: Not on file  Other Topics Concern   Not on file  Social History Narrative   Not on file   Social Determinants of Health   Financial Resource Strain: Not on file  Food Insecurity: Not on file  Transportation Needs: Not on file  Physical Activity: Not on file  Stress: Not on file  Social Connections: Not on file   Family History  Problem Relation Age of Onset   Breast cancer Maternal Aunt    Heart disease Mother    Anuerysm Mother    Heart Problems Mother    Heart attack Mother    Heart defect Father    Heart Problems Sister    Heart attack Sister    Heart defect Brother    Heart disease Maternal Grandmother    No Known Allergies Prior to Admission medications   Medication Sig Start Date End Date Taking? Authorizing Provider  aspirin EC 81 MG tablet Take 81 mg by mouth  every other day. Swallow whole.   Yes [provider]  atenolol (TENORMIN) 50 MG tablet Take 50 mg by mouth daily. 10/20/16  Yes [provider]  chlorthalidone (HYGROTON) 25 MG tablet Take 25 mg by mouth daily. 10/20/16  Yes [provider]  Cyanocobalamin (B-12 PO) Take 1 tablet by mouth daily.   Yes [provider]  glimepiride (AMARYL) 1 MG tablet Take 1 mg by mouth every morning.   Yes [provider]  MELATONIN PO Take 1 tablet by mouth at bedtime.   Yes [provider]  metFORMIN (GLUCOPHAGE-XR) 500 MG 24 hr tablet Take 1,000 mg by mouth at bedtime.   Yes [provider]  Multiple Vitamins-Minerals (CENTRUM SILVER ULTRA WOMENS) TABS Take 1 tablet by mouth at bedtime.   Yes [provider]  OVER THE COUNTER MEDICATION Take 2 capsules by mouth daily. Balance of [employer] and Veggies   Yes [provider]     All other systems have been reviewed and were otherwise negative with the exception of those mentioned in the HPI and as above.  Physical Exam: Vitals:   08/12/22 0607  BP: 138/77  Pulse: (!) 58  Resp: 18  Temp: 98.1  F (36.7 C)  SpO2: 97%    Body mass index is 32.14 kg/m.  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  Plan for Procedure(s): LEFT-SIDED LUMBAR 2- LUMBAR 3 TRANSFORAMINAL LUMBAR INTERBODY FUSION AND DECOMPRESSION WITH INSTRUMENTATION AND ALLOGRAFT   Norva Karvonen, MD 08/12/2022 6:38 AM

## 2022-08-12 NOTE — Anesthesia Postprocedure Evaluation (Signed)
Anesthesia Post Note  Patient: Sherri Thomas  Procedure(s) Performed: LEFT-SIDED LUMBAR 2- LUMBAR 3 TRANSFORAMINAL LUMBAR INTERBODY FUSION AND DECOMPRESSION WITH INSTRUMENTATION AND ALLOGRAFT (Left: Spine Lumbar)     Patient location during evaluation: PACU Anesthesia Type: General Level of consciousness: awake and alert Pain management: pain level controlled Vital Signs Assessment: post-procedure vital signs reviewed and stable Respiratory status: spontaneous breathing, nonlabored ventilation, respiratory function stable and patient connected to nasal cannula oxygen Cardiovascular status: blood pressure returned to baseline and stable Postop Assessment: no apparent nausea or vomiting Anesthetic complications: no  No notable events documented.  Last Vitals:  Vitals:   08/12/22 1230 08/12/22 1245  BP: (!) 118/49 120/60  Pulse: 66   Resp: 12 11  Temp:    SpO2: 96% 97%    Last Pain:  Vitals:   08/12/22 1245  TempSrc:   PainSc: Asleep   Pain Goal: Patients Stated Pain Goal: 5 (08/12/22 0607)  LLE Motor Response: Purposeful movement (08/12/22 1245) LLE Sensation: Full sensation (08/12/22 1245) RLE Motor Response: Purposeful movement (08/12/22 1245) RLE Sensation: Full sensation (08/12/22 1245)        Barnet Glasgow

## 2022-08-12 NOTE — Transfer of Care (Signed)
Immediate Anesthesia Transfer of Care Note  Patient: Sherri Thomas  Procedure(s) Performed: LEFT-SIDED LUMBAR 2- LUMBAR 3 TRANSFORAMINAL LUMBAR INTERBODY FUSION AND DECOMPRESSION WITH INSTRUMENTATION AND ALLOGRAFT (Left: Spine Lumbar)  Patient Location: PACU  Anesthesia Type:General  Level of Consciousness: drowsy, patient cooperative, and responds to stimulation  Airway & Oxygen Therapy: Patient Spontanous Breathing and Patient connected to nasal cannula oxygen  Post-op Assessment: Report given to RN, Post -op Vital signs reviewed and stable, and Patient moving all extremities X 4  Post vital signs: Reviewed and stable  Last Vitals:  Vitals Value Taken Time  BP 137/85 08/12/22 1106  Temp    Pulse 85 08/12/22 1110  Resp 14 08/12/22 1110  SpO2 99 % 08/12/22 1110  Vitals shown include unvalidated device data.  Last Pain:  Vitals:   08/12/22 0607  TempSrc: Oral  PainSc: 6       Patients Stated Pain Goal: 5 ([id number])  Complications: No notable events documented.

## 2022-08-12 NOTE — Op Note (Signed)
PATIENT NAME: Sherri Thomas   MEDICAL RECORD NO.:   [id number]   DATE OF BIRTH: 1965-08-20   DATE OF PROCEDURE: 08/12/2022                               OPERATIVE REPORT     PREOPERATIVE DIAGNOSES: 1. Left-sided lumbar radiculopathy (M51.16) 2. L2-3 spinal stenosis. 3. 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 Left-sided lumbar radiculopathy (M51.16) 2. L2-3 spinal stenosis. 3. 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 L2/3 decompression, with removal of large left-sided L2-3 disc herniation 2. Left-sided L2/3 transforaminal lumbar interbody fusion. 3. Right-sided L2/3 posterolateral fusion. 4. Insertion of interbody device x1 (Globus expandable intervertebral spacer). 5. Placement of segmental posterior instrumentation L2, L3 bilaterally  6. Use of local autograft. 7. Use of morselized allograft - Vivigen 8. Intraoperative use of fluoroscopy.   SURGEON:  Phylliss Bob, MD.   ASSISTANTPricilla Holm, PA-C.   ANESTHESIA:  General endotracheal anesthesia.   COMPLICATIONS:  None.   DISPOSITION:  Stable.   ESTIMATED BLOOD LOSS:  100cc   INDICATIONS FOR SURGERY:  Briefly,  [name] is a pleasant 57 year old female who did present to me with severe and ongoing pain, weakness, and numbness, and the left leg. I did feel that her symptoms were secondary to the findings noted above.   The patient failed conservative care and did wish to proceed with the procedure  noted above.   OPERATIVE DETAILS:  On 08/12/2022, the patient was brought to surgery and general endotracheal anesthesia was administered.  The patient was placed prone on a well-padded flat Jackson bed with a spinal frame.  Antibiotics were given and a time-out procedure was performed. The back was prepped and draped in the usual  fashion.  A midline incision was made overlying the L4-5 intervertebral spaces.  The fascia was incised at the midline.  The paraspinal musculature was bluntly swept laterally.  Anatomic landmarks for the pedicles were exposed. Using fluoroscopy, I did cannulate the L2 and L3 pedicles bilaterally, using a medial to lateral cortical trajectory technique.  At this point, 5 and 6 mm screws were placed into the right pedicles, and a 40 mm rod was placed into the tulip heads of the screw, and caps were also placed. Distraction was then applied across the L2/3 intervertebral space, and the caps were then provisionally tightened.  On the left side, bone wax was placed into the cannulated pedicle holes.  I then proceeded with the decompressive aspect of the procedure at the L2/3 level.  A full facetectomy was performed on the left at L2/3. At this point, the exiting left L2 nerve and traversing left L3 nerve was noted.  The left L3 nerve was gently medialized, and in doing so, a very large disc fragment was noted.  This was clearly causing severe compression of the left L3 nerve. This fragment was removed in its entirety.  Of note, removal of the herniation was quite meticulous, as it was very much adherent to the traversing left L3 nerve and exiting left L2 nerve. I then gently superiorly mobilized the exiting left L2 nerve, and additional disc material was noted and uneventfully removed.  Once again, abundant adhesions were noted, however, the adhesions were released and the herniated disc material was  removed.  In this fashion, I was able to entirely decompress the left L2 and L3 nerves. With an assistant holding gentle medial retraction of the traversing left L3 nerve, I did perform an annulotomy at the posterolateral aspect of the L2/3 intervertebral space.  I then used a series of curettes and pituitary rongeurs to perform a thorough and complete intervertebral diskectomy.  The intervertebral space was  then liberally packed with autograft as well as allograft in the form of Vivigen, as was the appropriate-sized intervertebral spacer.  The spacer was then tamped into position in the usual fashion, and expanded to 9.4 mm in height. I was very pleased with the press-fit of the spacer.  I then placed 5 and 6 mm screws on the left at L2 and L3. A 40-mm rod was then placed and caps were placed. The distraction was then released on the contralateral side.  All caps were then locked.  The wound was copiously irrigated with a total of approximately 3 L prior to placing the bone graft.  Additional autograft and allograft was then packed into the posterolateral gutter on the right side to help aid in the success of the fusion.  The wound was  explored for any undue bleeding and there was no substantial bleeding encountered.  Gel-Foam was placed over the laminectomy site.  The wound was then closed in layers using #1 Vicryl followed by 2-0 Vicryl, followed by 4-0 Monocryl.  Benzoin and Steri-Strips were applied followed by sterile dressing.     Of note, Pricilla Holm was my assistant throughout surgery, and did aid in retraction, suctioning, the decompression, placement of the hardware, and closure.       Phylliss Bob, MD

## 2022-08-13 DIAGNOSIS — E119 Type 2 diabetes mellitus without complications: Secondary | ICD-10-CM | POA: Diagnosis not present

## 2022-08-13 DIAGNOSIS — Z7982 Long term (current) use of aspirin: Secondary | ICD-10-CM | POA: Diagnosis not present

## 2022-08-13 DIAGNOSIS — Z7984 Long term (current) use of oral hypoglycemic drugs: Secondary | ICD-10-CM | POA: Diagnosis not present

## 2022-08-13 DIAGNOSIS — Z79899 Other long term (current) drug therapy: Secondary | ICD-10-CM | POA: Diagnosis not present

## 2022-08-13 DIAGNOSIS — I10 Essential (primary) hypertension: Secondary | ICD-10-CM | POA: Diagnosis not present

## 2022-08-13 DIAGNOSIS — M48061 Spinal stenosis, lumbar region without neurogenic claudication: Secondary | ICD-10-CM | POA: Diagnosis not present

## 2022-08-13 DIAGNOSIS — M5116 Intervertebral disc disorders with radiculopathy, lumbar region: Secondary | ICD-10-CM | POA: Diagnosis not present

## 2022-08-13 LAB — GLUCOSE, CAPILLARY: Glucose-Capillary: 132 mg/dL — ABNORMAL HIGH (ref 70–99)

## 2022-08-13 MED ORDER — OXYCODONE-ACETAMINOPHEN 5-325 MG PO TABS: 1.0000 | ORAL_TABLET | ORAL | 0 refills | Status: AC | PRN

## 2022-08-13 MED ORDER — METHOCARBAMOL 500 MG PO TABS: 500.0000 mg | ORAL_TABLET | Freq: Four times a day (QID) | ORAL | 2 refills | Status: AC | PRN

## 2022-08-13 NOTE — Progress Notes (Signed)
Patient alert and oriented, VSS, void and ambulate. Surgical site clean and dry. D/c instructions explain and given all questions answered. Patient d/c home per order

## 2022-08-13 NOTE — Evaluation (Signed)
Physical Therapy Evaluation  Patient Details Name: Sherri Thomas MRN: [id number] DOB: March 24, 1966 Today's Date: 08/13/2022  History of Present Illness  Pt is a 57 y/o female who presents s/p L2-L3 TLIF on 08/12/2022. PMH significant for DM, HTN, PVD.  Clinical Impression  Pt admitted with above diagnosis. At the time of PT eval, pt was able to demonstrate transfers and ambulation with gross min guard assist to supervision for safety and no AD. Pt was educated on precautions, brace application/wearing schedule, appropriate activity progression, and car transfer. Pt currently with functional limitations due to the deficits listed below (see PT Problem List). Pt will benefit from skilled PT to increase their independence and safety with mobility to allow discharge to the venue listed below.         Recommendations for follow up therapy are one component of a multi-disciplinary discharge planning process, led by the attending physician.  Recommendations may be updated based on patient status, additional functional criteria and insurance authorization.  Follow Up Recommendations       Assistance Recommended at Discharge PRN  Patient can return home with the following  A little help with walking and/or transfers;A little help with bathing/dressing/bathroom;Assistance with cooking/housework;Assist for transportation;Help with stairs or ramp for entrance    Equipment Recommendations None recommended by PT  Recommendations for Other Services       Functional Status Assessment Patient has had a recent decline in their functional status and demonstrates the ability to make significant improvements in function in a reasonable and predictable amount of time.     Precautions / Restrictions Precautions Precautions: Fall;Back Precaution Booklet Issued: Yes (comment) Precaution Comments: Reviewed handout and pt was cued for precautions during functional mobility. Required Braces or Orthoses: Spinal  Brace Spinal Brace: Thoracolumbosacral orthotic;Applied in sitting position Restrictions Weight Bearing Restrictions: No      Mobility  Bed Mobility Overal bed mobility: Needs Assistance             General bed mobility comments: OOB in chair upon arrival - reviewed log roll verbally    Transfers Overall transfer level: Needs assistance Equipment used: None Transfers: Sit to/from Stand Sit to Stand: Supervision           General transfer comment: VC's for hand placement on seated surface for safety. Encouraged wide BOS for stand>sit.    Ambulation/Gait Ambulation/Gait assistance: Min guard Gait Distance (Feet): 300 Feet Assistive device: None Gait Pattern/deviations: Step-through pattern, Decreased stride length, Trunk flexed Gait velocity: Decreased Gait velocity interpretation: 1.31 - 2.62 ft/sec, indicative of limited community ambulator   General Gait Details: VC's for improved posture throughout. Pt without gross unsteadiness or overt LOB.  Stairs Stairs: Yes Stairs assistance: Min guard Stair Management: Step to pattern, Forwards, One rail Left Number of Stairs: 10 General stair comments: VC's for sequencing and general safety. No assist required but close guard provided for safety.  Wheelchair Mobility    Modified Rankin (Stroke Patients Only)       Balance Overall balance assessment: No apparent balance deficits (not formally assessed)                                           Pertinent Vitals/Pain Pain Assessment Pain Assessment: Faces Faces Pain Scale: Hurts little more Pain Location: back Pain Descriptors / Indicators: Discomfort Pain Intervention(s): Limited activity within patient's tolerance, Monitored during session, Repositioned  Home Living Family/patient expects to be discharged to:: Private residence Living Arrangements: Spouse/significant other;Children Available Help at Discharge: Family;Available 24  hours/day Type of Home: House Home Access: Stairs to enter Entrance Stairs-Rails: Psychiatric nurse of Steps: 5 Alternate Level Stairs-Number of Steps: flight Home Layout: Two level;Bed/bath upstairs;1/2 bath on main level Home Equipment: None Additional Comments: able to borrow DME if needed    Prior Function Prior Level of Function : Independent/Modified Independent;Driving             Mobility Comments: no AD ADLs Comments: works part time     Journalist, newspaper   Dominant Hand: Right    Extremity/Trunk Assessment   Upper Extremity Assessment Upper Extremity Assessment: Overall WFL for tasks assessed    Lower Extremity Assessment Lower Extremity Assessment: Generalized weakness (Mild; consistent with pre-op diagnosis)    Cervical / Trunk Assessment Cervical / Trunk Assessment: Back Surgery  Communication   Communication: No difficulties  Cognition Arousal/Alertness: Awake/alert Behavior During Therapy: WFL for tasks assessed/performed Overall Cognitive Status: Within Functional Limits for tasks assessed                                          General Comments      Exercises     Assessment/Plan    PT Assessment Patient needs continued PT services  PT Problem List Decreased strength;Decreased activity tolerance;Decreased mobility;Decreased balance;Decreased knowledge of use of DME;Decreased safety awareness;Decreased knowledge of precautions;Pain       PT Treatment Interventions DME instruction;Gait training;Stair training;Functional mobility training;Therapeutic activities;Therapeutic exercise;Balance training;Patient/family education    PT Goals (Current goals can be found in the Care Plan section)  Acute Rehab PT Goals Patient Stated Goal: Home today PT Goal Formulation: With patient/family Time For Goal Achievement: 08/20/22 Potential to Achieve Goals: Good    Frequency Min 5X/week     Co-evaluation                AM-PAC PT "6 Clicks" Mobility  Outcome Measure Help needed turning from your back to your side while in a flat bed without using bedrails?: A Little Help needed moving from lying on your back to sitting on the side of a flat bed without using bedrails?: A Little Help needed moving to and from a bed to a chair (including a wheelchair)?: A Little Help needed standing up from a chair using your arms (e.g., wheelchair or bedside chair)?: A Little Help needed to walk in hospital room?: A Little Help needed climbing 3-5 steps with a railing? : A Little 6 Click Score: 18    End of Session Equipment Utilized During Treatment: Gait belt;Back brace Activity Tolerance: Patient tolerated treatment well Patient left: in chair;with call bell/phone within reach;with family/visitor present Nurse Communication: Mobility status PT Visit Diagnosis: Unsteadiness on feet (R26.81);Pain Pain - part of body:  (back)    Time: [id number] PT Time Calculation (min) (ACUTE ONLY): 17 min   Charges:   PT Evaluation $PT Eval Low Complexity: 1 Low          Rolinda Roan, PT, DPT Acute Rehabilitation Services Secure Chat Preferred Office: [phone]   Thelma Comp 08/13/2022, 2:37 PM

## 2022-08-13 NOTE — Plan of Care (Signed)

## 2022-08-13 NOTE — Progress Notes (Signed)
    Patient doing well  Patient denies leg pain Minimal back pain   Physical Exam: Vitals:   08/13/22 0437 08/13/22 0720  BP: (!) 109/51 (!) 126/58  Pulse: 73 75  Resp: 18 16  Temp: 98.9 F (37.2 C) 98.7 F (37.1 C)  SpO2: 94% 94%    Dressing in place NVI  POD #1 s/p L2/3 decompression and fusion, doing very well  - up with PT/OT, encourage ambulation - Percocet for pain, Robaxin for muscle spasms - d/c home today with f/u in 2 weeks

## 2022-08-13 NOTE — Evaluation (Signed)
Occupational Therapy Evaluation Patient Details Name: Sherri Thomas MRN: [id number] DOB: 06-26-1965 Today's Date: 08/13/2022   History of Present Illness Sherri Thomas is a 57 y.o. female who is s/p LEFT-SIDED LUMBAR 2- LUMBAR 3 TRANSFORAMINAL LUMBAR INTERBODY FUSION AND DECOMPRESSION WITH INSTRUMENTATION AND ALLOGRAFT. PMHx: DM, HTN, PVD   Clinical Impression   Pam was evaluated s/p the above spine surgery. She is indep and works part time at baseline. Upon evaluation she was limited by expected back pain, knowledge of precautions, and decreased activity tolerance. Overall she completed transfers and mobility with superivsion A, no AD needed and required min A for LB ADLs. Provided cues and education on spinal precautions and compensatory techniques throughout, handout provided and pt demonstrated great recall. Pt does not require further acute OT services. Recommend d/c home with support of family.        Recommendations for follow up therapy are one component of a multi-disciplinary discharge planning process, led by the attending physician.  Recommendations may be updated based on patient status, additional functional criteria and insurance authorization.   Assistance Recommended at Discharge Intermittent Supervision/Assistance  Patient can return home with the following A little help with walking and/or transfers;A little help with bathing/dressing/bathroom;Assistance with cooking/housework;Assist for transportation    Functional Status Assessment  Patient has had a recent decline in their functional status and demonstrates the ability to make significant improvements in function in a reasonable and predictable amount of time.  Equipment Recommendations  None recommended by OT    Recommendations for Other Services       Precautions / Restrictions Precautions Precautions: Fall;Back Precaution Booklet Issued: Yes (comment) Required Braces or Orthoses: Spinal Brace Spinal Brace:  Thoracolumbosacral orthotic;Applied in sitting position Restrictions Weight Bearing Restrictions: No      Mobility Bed Mobility Overal bed mobility: Needs Assistance             General bed mobility comments: OOB in chair upon arrival - reviewed log roll    Transfers Overall transfer level: Needs assistance Equipment used: None Transfers: Sit to/from Stand Sit to Stand: Supervision                  Balance Overall balance assessment: No apparent balance deficits (not formally assessed)                                         ADL either performed or assessed with clinical judgement   ADL Overall ADL's : Needs assistance/impaired Eating/Feeding: Independent   Grooming: Supervision/safety;Standing   Upper Body Bathing: Set up;Sitting   Lower Body Bathing: Min guard;Sit to/from stand   Upper Body Dressing : Set up;Sitting Upper Body Dressing Details (indicate cue type and reason): cues for back brace Lower Body Dressing: Minimal assistance;Sit to/from stand   Toilet Transfer: Supervision/safety;Ambulation   Toileting- Clothing Manipulation and Hygiene: Supervision/safety;Sitting/lateral lean       Functional mobility during ADLs: Supervision/safety General ADL Comments: no AD, cues for back precautions and compensatory techniques     Vision Baseline Vision/History: 0 No visual deficits Vision Assessment?: No apparent visual deficits     Perception Perception Perception Tested?: No   Praxis Praxis Praxis tested?: Not tested    Pertinent Vitals/Pain Pain Assessment Pain Assessment: Faces Faces Pain Scale: Hurts little more Pain Location: back Pain Descriptors / Indicators: Discomfort Pain Intervention(s): Limited activity within patient's tolerance, Monitored during session  Hand Dominance Right   Extremity/Trunk Assessment Upper Extremity Assessment Upper Extremity Assessment: Overall WFL for tasks assessed   Lower  Extremity Assessment Lower Extremity Assessment: Defer to PT evaluation   Cervical / Trunk Assessment Cervical / Trunk Assessment: Back Surgery   Communication     Cognition Arousal/Alertness: Awake/alert Behavior During Therapy: WFL for tasks assessed/performed Overall Cognitive Status: Within Functional Limits for tasks assessed                                       General Comments  VSS on RA, husband present    Exercises     Shoulder Instructions      Home Living Family/patient expects to be discharged to:: Private residence Living Arrangements: Spouse/significant other;Children Available Help at Discharge: Family;Available 24 hours/day Type of Home: House Home Access: Stairs to enter [employer] of Steps: 5 Entrance Stairs-Rails: Right;Left Home Layout: Two level;Bed/bath upstairs;1/2 bath on main level Alternate Level Stairs-Number of Steps: flight Alternate Level Stairs-Rails: Left Bathroom Shower/Tub: Walk-in shower;Tub only   Bathroom Toilet: Handicapped height     Home Equipment: None   Additional Comments: able to borrow DME if needed      Prior Functioning/Environment Prior Level of Function : Independent/Modified Independent;Driving             Mobility Comments: no AD ADLs Comments: works part time        OT Problem List: Decreased range of motion;Decreased strength;Decreased activity tolerance;Decreased knowledge of precautions;Pain      OT Treatment/Interventions:      OT Goals(Current goals can be found in the care plan section) Acute Rehab OT Goals Patient Stated Goal: home OT Goal Formulation: With patient Time For Goal Achievement: 08/13/22 Potential to Achieve Goals: Good  OT Frequency:      Co-evaluation              AM-PAC OT "6 Clicks" Daily Activity     Outcome Measure Help from another person eating meals?: None Help from another person taking care of personal grooming?: A Little Help  from another person toileting, which includes using toliet, bedpan, or urinal?: A Little Help from another person bathing (including washing, rinsing, drying)?: A Little Help from another person to put on and taking off regular upper body clothing?: A Little Help from another person to put on and taking off regular lower body clothing?: A Little 6 Click Score: 19   End of Session Equipment Utilized During Treatment: Back brace Nurse Communication: Mobility status  Activity Tolerance: Patient tolerated treatment well Patient left: in chair;with call bell/phone within reach;with family/visitor present  OT Visit Diagnosis: Unsteadiness on feet (R26.81);Other abnormalities of gait and mobility (R26.89);Muscle weakness (generalized) (M62.81);Pain                Time: [id number] OT Time Calculation (min): 18 min Charges:  OT General Charges $OT Visit: 1 Visit OT Evaluation $OT Eval Moderate Complexity: 1 Mod  Shade Flood, OTR/L [place] [facility] [place] Communication Preferred   Elliot Cousin 08/13/2022, 11:08 AM

## 2022-08-19 ENCOUNTER — Encounter (HOSPITAL_COMMUNITY): Payer: Self-pay | Admitting: Orthopedic Surgery

## 2022-08-27 NOTE — Discharge Summary (Signed)
Patient ID: Sherri Thomas MRN: [id number] DOB/AGE: 11-10-1965 57 y.o.  Admit date: 08/12/2022 Discharge date: 08/13/2022  Admission Diagnoses:  Principal Problem:   Radiculopathy, lumbar region   Discharge Diagnoses:  Same  Past Medical History:  Diagnosis Date   Diabetes mellitus without complication    Hypertension    Peripheral vascular disease     Surgeries: Procedure(s): LEFT-SIDED LUMBAR 2- LUMBAR 3 TRANSFORAMINAL LUMBAR INTERBODY FUSION AND DECOMPRESSION WITH INSTRUMENTATION AND ALLOGRAFT on 08/12/2022   Consultants: None  Discharged Condition: Improved  Hospital Course: Sherri Thomas is an 57 y.o. female who was admitted 08/12/2022 for operative treatment of Radiculopathy, lumbar region. Patient has severe unremitting pain that affects sleep, daily activities, and work/hobbies. After pre-op clearance the patient was taken to the operating room on 08/12/2022 and underwent  Procedure(s): LEFT-SIDED LUMBAR 2- LUMBAR 3 TRANSFORAMINAL LUMBAR INTERBODY FUSION AND DECOMPRESSION WITH INSTRUMENTATION AND ALLOGRAFT.    Patient was given perioperative antibiotics:  Anti-infectives (From admission, onward)    Start     Dose/Rate Route Frequency Ordered Stop   08/12/22 1500  ceFAZolin (ANCEF) IVPB 2g/100 mL premix        2 g 200 mL/hr over 30 Minutes Intravenous Every 8 hours 08/12/22 1309 08/13/22 0754   08/12/22 0600  ceFAZolin (ANCEF) IVPB 2g/100 mL premix        2 g 200 mL/hr over 30 Minutes Intravenous On call to O.R. 08/12/22 0539 08/12/22 0805        Patient was given sequential compression devices, early ambulation to prevent DVT.  Patient benefited maximally from hospital stay and there were no complications.    Recent vital signs: BP (!) 126/58 (BP Location: Right Arm)   Pulse 75   Temp 98.7 F (37.1 C) (Oral)   Resp 16   Ht  (1.575 m)   Wt 79.7 kg   SpO2 94%   BMI 32.14 kg/m    Discharge Medications:   Allergies as of 08/13/2022   No Known  Allergies      Medication List     TAKE these medications    aspirin EC 81 MG tablet Take 81 mg by mouth every other day. Swallow whole.   atenolol 50 MG tablet Commonly known as: TENORMIN Take 50 mg by mouth daily.   B-12 PO Take 1 tablet by mouth daily.   Centrum Silver Ultra Womens Tabs Take 1 tablet by mouth at bedtime.   chlorthalidone 25 MG tablet Commonly known as: HYGROTON Take 25 mg by mouth daily.   glimepiride 1 MG tablet Commonly known as: AMARYL Take 1 mg by mouth every morning.   MELATONIN PO Take 1 tablet by mouth at bedtime.   metFORMIN 500 MG 24 hr tablet Commonly known as: GLUCOPHAGE-XR Take 1,000 mg by mouth at bedtime.   methocarbamol 500 MG tablet Commonly known as: ROBAXIN Take 1 tablet (500 mg total) by mouth every 6 (six) hours as needed for muscle spasms.   OVER THE COUNTER MEDICATION Take 2 capsules by mouth daily. Balance of Nature Fruit and Veggies   oxyCODONE-acetaminophen 5-325 MG tablet Commonly known as: PERCOCET/ROXICET Take 1-2 tablets by mouth every 4 (four) hours as needed for severe pain.        Diagnostic Studies: DG Lumbar Spine 2-3 Views  Result Date: 08/12/2022 CLINICAL DATA:  [id number] Surgery, elective [id number] EXAM: LUMBAR SPINE - 2-3 VIEW COMPARISON:  Lumbar spine radiograph 08/12/2022 FINDINGS: Intraoperative images during L2-L3 lumbar fusion. Hardware is intact. No evidence  of immediate complication. IMPRESSION: Intraoperative images during L2-L3 lumbar fusion. Intact hardware without evidence of immediate complication. Electronically Signed   By: Caprice Renshaw M.D.   On: 08/12/2022 10:51   DG C-Arm 1-60 Min-No Report  Result Date: 08/12/2022 Fluoroscopy was utilized by the requesting physician.  No radiographic interpretation.   DG C-Arm 1-60 Min-No Report  Result Date: 08/12/2022 Fluoroscopy was utilized by the requesting physician.  No radiographic interpretation.   DG C-Arm 1-60 Min-No Report  Result Date:  08/12/2022 Fluoroscopy was utilized by the requesting physician.  No radiographic interpretation.   DG Lumbar Spine 1 View  Result Date: 08/12/2022 CLINICAL DATA:  Provided history: Surgery, elective. Additional history provided: Localization images for left-sided lumbar 2-lumbar 3 transforaminal lumbar interbody fusion and decompression with instrumentation and allograft. EXAM: LUMBAR SPINE - 1 VIEW COMPARISON:  Lumbar spine MRI 05/30/2022. CT abdomen/pelvis 06/09/2014. FINDINGS: Two lateral view intraoperative radiographs of the lumbar spine are submitted. 5 lumbar vertebrae. The caudal most well-formed intervertebral disc space is designated L5-S1. Small ribs are present bilaterally at the T12 level. On the initial image acquired at 8:58 a.m., metallic instruments project posterior to the spine at the T12 and L1 levels. On the subsequent radiograph acquired at 9 a.m., surgical instruments project posterior to the spine at the L1 and L2 vertebral levels. IMPRESSION: Two lateral view intraoperative radiographs of the lumbar spine, as described. Electronically Signed   By: Jackey Loge D.O.   On: 08/12/2022 08:39    Disposition: Discharge disposition: 01-Home or Self Care        POD #1 s/p L2/3 decompression and fusion, doing very well   - up with PT/OT, encourage ambulation - Percocet for pain, Robaxin for muscle spasms -Scripts for pain sent to pharmacy electronically  -D/C instructions sheet printed and in chart -D/C today  -F/U in office 2 weeks   Signed: Eilene Ghazi Linsey Hirota 08/27/2022, 12:26 PM

## 2022-09-26 DIAGNOSIS — M5416 Radiculopathy, lumbar region: Secondary | ICD-10-CM | POA: Diagnosis not present

## 2022-10-08 ENCOUNTER — Other Ambulatory Visit: Payer: Self-pay | Admitting: Family Medicine

## 2022-10-08 DIAGNOSIS — Z0000 Encounter for general adult medical examination without abnormal findings: Secondary | ICD-10-CM

## 2022-10-29 ENCOUNTER — Ambulatory Visit
Admission: RE | Admit: 2022-10-29 | Discharge: 2022-10-29 | Disposition: A | Discharge status: Home / Self Care | Payer: BC Managed Care – PPO | Source: Ambulatory Visit | Attending: Family Medicine | Admitting: Family Medicine

## 2022-10-29 DIAGNOSIS — I10 Essential (primary) hypertension: Secondary | ICD-10-CM | POA: Diagnosis not present

## 2022-10-29 DIAGNOSIS — E119 Type 2 diabetes mellitus without complications: Secondary | ICD-10-CM | POA: Diagnosis not present

## 2022-10-29 DIAGNOSIS — Z0000 Encounter for general adult medical examination without abnormal findings: Secondary | ICD-10-CM

## 2022-10-29 DIAGNOSIS — Z1231 Encounter for screening mammogram for malignant neoplasm of breast: Secondary | ICD-10-CM | POA: Diagnosis not present

## 2022-11-07 DIAGNOSIS — M5416 Radiculopathy, lumbar region: Secondary | ICD-10-CM | POA: Diagnosis not present

## 2022-11-17 DIAGNOSIS — M4326 Fusion of spine, lumbar region: Secondary | ICD-10-CM | POA: Diagnosis not present

## 2022-11-17 DIAGNOSIS — R531 Weakness: Secondary | ICD-10-CM | POA: Diagnosis not present

## 2022-11-19 DIAGNOSIS — M4326 Fusion of spine, lumbar region: Secondary | ICD-10-CM | POA: Diagnosis not present

## 2022-11-19 DIAGNOSIS — R531 Weakness: Secondary | ICD-10-CM | POA: Diagnosis not present

## 2022-11-25 DIAGNOSIS — M4326 Fusion of spine, lumbar region: Secondary | ICD-10-CM | POA: Diagnosis not present

## 2022-11-25 DIAGNOSIS — R531 Weakness: Secondary | ICD-10-CM | POA: Diagnosis not present

## 2022-11-27 DIAGNOSIS — M4326 Fusion of spine, lumbar region: Secondary | ICD-10-CM | POA: Diagnosis not present

## 2022-11-27 DIAGNOSIS — R531 Weakness: Secondary | ICD-10-CM | POA: Diagnosis not present

## 2022-12-02 DIAGNOSIS — M4326 Fusion of spine, lumbar region: Secondary | ICD-10-CM | POA: Diagnosis not present

## 2022-12-02 DIAGNOSIS — R531 Weakness: Secondary | ICD-10-CM | POA: Diagnosis not present

## 2022-12-04 DIAGNOSIS — M4326 Fusion of spine, lumbar region: Secondary | ICD-10-CM | POA: Diagnosis not present

## 2022-12-04 DIAGNOSIS — R531 Weakness: Secondary | ICD-10-CM | POA: Diagnosis not present

## 2022-12-09 DIAGNOSIS — R531 Weakness: Secondary | ICD-10-CM | POA: Diagnosis not present

## 2022-12-09 DIAGNOSIS — M4326 Fusion of spine, lumbar region: Secondary | ICD-10-CM | POA: Diagnosis not present

## 2022-12-11 DIAGNOSIS — R531 Weakness: Secondary | ICD-10-CM | POA: Diagnosis not present

## 2022-12-11 DIAGNOSIS — M4326 Fusion of spine, lumbar region: Secondary | ICD-10-CM | POA: Diagnosis not present

## 2023-03-06 DIAGNOSIS — M545 Low back pain, unspecified: Secondary | ICD-10-CM | POA: Diagnosis not present

## 2023-03-16 DIAGNOSIS — Z124 Encounter for screening for malignant neoplasm of cervix: Secondary | ICD-10-CM | POA: Diagnosis not present

## 2023-03-16 DIAGNOSIS — Z1151 Encounter for screening for human papillomavirus (HPV): Secondary | ICD-10-CM | POA: Diagnosis not present

## 2023-03-16 DIAGNOSIS — Z01419 Encounter for gynecological examination (general) (routine) without abnormal findings: Secondary | ICD-10-CM | POA: Diagnosis not present

## 2023-03-16 DIAGNOSIS — R82998 Other abnormal findings in urine: Secondary | ICD-10-CM | POA: Diagnosis not present

## 2023-04-17 DIAGNOSIS — D2362 Other benign neoplasm of skin of left upper limb, including shoulder: Secondary | ICD-10-CM | POA: Diagnosis not present

## 2023-04-17 DIAGNOSIS — D220 Melanocytic nevi of lip: Secondary | ICD-10-CM | POA: Diagnosis not present

## 2023-04-17 DIAGNOSIS — D2261 Melanocytic nevi of right upper limb, including shoulder: Secondary | ICD-10-CM | POA: Diagnosis not present

## 2023-04-17 DIAGNOSIS — D2239 Melanocytic nevi of other parts of face: Secondary | ICD-10-CM | POA: Diagnosis not present

## 2023-07-10 DIAGNOSIS — I10 Essential (primary) hypertension: Secondary | ICD-10-CM | POA: Diagnosis not present

## 2023-07-10 DIAGNOSIS — M79605 Pain in left leg: Secondary | ICD-10-CM | POA: Diagnosis not present

## 2023-07-10 DIAGNOSIS — M1990 Unspecified osteoarthritis, unspecified site: Secondary | ICD-10-CM | POA: Diagnosis not present

## 2023-07-10 DIAGNOSIS — E119 Type 2 diabetes mellitus without complications: Secondary | ICD-10-CM | POA: Diagnosis not present

## 2023-11-23 ENCOUNTER — Other Ambulatory Visit: Payer: Self-pay | Admitting: Family Medicine

## 2023-11-23 DIAGNOSIS — Z0000 Encounter for general adult medical examination without abnormal findings: Secondary | ICD-10-CM

## 2023-12-18 ENCOUNTER — Ambulatory Visit
Admission: RE | Admit: 2023-12-18 | Discharge: 2023-12-18 | Disposition: A | Discharge status: Home / Self Care | Source: Ambulatory Visit | Attending: Family Medicine | Admitting: Family Medicine

## 2023-12-18 DIAGNOSIS — Z1231 Encounter for screening mammogram for malignant neoplasm of breast: Secondary | ICD-10-CM | POA: Diagnosis not present

## 2023-12-18 DIAGNOSIS — Z0000 Encounter for general adult medical examination without abnormal findings: Secondary | ICD-10-CM

## 2024-02-03 DIAGNOSIS — I10 Essential (primary) hypertension: Secondary | ICD-10-CM | POA: Diagnosis not present

## 2024-02-03 DIAGNOSIS — E119 Type 2 diabetes mellitus without complications: Secondary | ICD-10-CM | POA: Diagnosis not present

## 2024-02-05 DIAGNOSIS — E119 Type 2 diabetes mellitus without complications: Secondary | ICD-10-CM | POA: Diagnosis not present

## 2024-02-05 DIAGNOSIS — Z23 Encounter for immunization: Secondary | ICD-10-CM | POA: Diagnosis not present

## 2024-02-05 DIAGNOSIS — Z0000 Encounter for general adult medical examination without abnormal findings: Secondary | ICD-10-CM | POA: Diagnosis not present

## 2024-02-05 DIAGNOSIS — I10 Essential (primary) hypertension: Secondary | ICD-10-CM | POA: Diagnosis not present

## 2024-02-19 DIAGNOSIS — M7532 Calcific tendinitis of left shoulder: Secondary | ICD-10-CM | POA: Diagnosis not present

## 2024-02-29 DIAGNOSIS — K5730 Diverticulosis of large intestine without perforation or abscess without bleeding: Secondary | ICD-10-CM | POA: Diagnosis not present

## 2024-02-29 DIAGNOSIS — Z1211 Encounter for screening for malignant neoplasm of colon: Secondary | ICD-10-CM | POA: Diagnosis not present

## 2024-02-29 DIAGNOSIS — D122 Benign neoplasm of ascending colon: Secondary | ICD-10-CM | POA: Diagnosis not present

## 2024-02-29 DIAGNOSIS — D124 Benign neoplasm of descending colon: Secondary | ICD-10-CM | POA: Diagnosis not present

## 2024-03-16 DIAGNOSIS — Z130 Encounter for screening for diseases of the blood and blood-forming organs and certain disorders involving the immune mechanism: Secondary | ICD-10-CM | POA: Diagnosis not present

## 2024-03-16 DIAGNOSIS — Z01419 Encounter for gynecological examination (general) (routine) without abnormal findings: Secondary | ICD-10-CM | POA: Diagnosis not present
